# Patient Record
Sex: Male | Born: 1975
Health system: Southern US, Community
[De-identification: ages and names within clinical notes are randomized; demographics above are authoritative.]

## PROBLEM LIST (undated history)

## (undated) DIAGNOSIS — T7840XA Allergy, unspecified, initial encounter: Secondary | ICD-10-CM

## (undated) DIAGNOSIS — I10 Essential (primary) hypertension: Secondary | ICD-10-CM

## (undated) DIAGNOSIS — R569 Unspecified convulsions: Secondary | ICD-10-CM

## (undated) DIAGNOSIS — J45909 Unspecified asthma, uncomplicated: Secondary | ICD-10-CM

## (undated) HISTORY — PX: OTHER SURGICAL HISTORY: SHX169

## (undated) HISTORY — DX: Unspecified convulsions: R56.9

## (undated) HISTORY — DX: Unspecified asthma, uncomplicated: J45.909

## (undated) HISTORY — PX: KIDNEY SURGERY: SHX687

## (undated) HISTORY — DX: Essential (primary) hypertension: I10

## (undated) HISTORY — DX: Allergy, unspecified, initial encounter: T78.40XA

---

## 2001-06-06 HISTORY — PX: WRIST ARTHROSCOPY: SUR100

## 2003-01-20 ENCOUNTER — Ambulatory Visit (HOSPITAL_BASED_OUTPATIENT_CLINIC_OR_DEPARTMENT_OTHER): Admission: RE | Admit: 2003-01-20 | Discharge: 2003-01-20 | Payer: Self-pay | Admitting: Orthopedic Surgery

## 2011-01-25 ENCOUNTER — Ambulatory Visit (INDEPENDENT_AMBULATORY_CARE_PROVIDER_SITE_OTHER): Payer: 59 | Admitting: Gynecology

## 2011-01-25 DIAGNOSIS — N469 Male infertility, unspecified: Secondary | ICD-10-CM

## 2012-03-18 ENCOUNTER — Ambulatory Visit (INDEPENDENT_AMBULATORY_CARE_PROVIDER_SITE_OTHER): Payer: 59 | Admitting: Internal Medicine

## 2012-03-18 ENCOUNTER — Other Ambulatory Visit: Payer: Self-pay | Admitting: Internal Medicine

## 2012-03-18 ENCOUNTER — Ambulatory Visit: Payer: 59

## 2012-03-18 VITALS — BP 140/93 | HR 79 | Temp 97.6°F | Resp 18 | Ht 71.75 in | Wt 240.0 lb

## 2012-03-18 DIAGNOSIS — I10 Essential (primary) hypertension: Secondary | ICD-10-CM | POA: Insufficient documentation

## 2012-03-18 DIAGNOSIS — M25561 Pain in right knee: Secondary | ICD-10-CM

## 2012-03-18 DIAGNOSIS — M2391 Unspecified internal derangement of right knee: Secondary | ICD-10-CM

## 2012-03-18 DIAGNOSIS — M25569 Pain in unspecified knee: Secondary | ICD-10-CM

## 2012-03-18 DIAGNOSIS — M2390 Unspecified internal derangement of unspecified knee: Secondary | ICD-10-CM

## 2012-03-18 MED ORDER — MELOXICAM 15 MG PO TABS: 15.0000 mg | ORAL_TABLET | Freq: Every day | ORAL | Status: DC

## 2012-03-18 NOTE — Progress Notes (Addendum)
  Subjective:    Patient ID: Jerome Hensley, male    DOB: June 29, 1975, 36 y.o.   MRN: [id number]  HPIYesterday he jumped off fence and right leg buckled Within one to 2 hours he noted swelling and pain and inability to bear weight This remained overnight and today he is unable to walk/Comes in a wheelchair/crutches No prior injury  Nonsmoker No alcohol Married Works sitting/IT  Review of Systems On the systolic and trtibenzor were for hypertension   No recent medical problems  Objective:   Physical Exam Right knee is swollen generally without localized effusion He has restricted range of motion and is unable to reach 90 of flexion or 180 degr extension Markedly tender over the lateral collateral ligament with pain on stressing Tender over the lateral aspect of the quadriceps without defect Guarding prevents adequate anterior drawer or Lachman's Medial collateral ligament is nontender   [facility] reading (PRIMARY) by  Dr.Tykeem Lanzer= no fx      Assessment & Plan:  Problem #1 lateral collateral ligament sprain with ?int derangement/?ACL tear  Straight leg immobilizer/crutches/nonweightbearing/continue ice/Mobic 15 mg Set up MRI knee

## 2012-03-22 ENCOUNTER — Telehealth: Payer: Self-pay

## 2012-03-22 NOTE — Telephone Encounter (Signed)
Patient is calling for pain medication for his knee and to inquire about the status of his MRI. Let him know MRI is waiting for precert. He says Dr Merla Riches was supposed to send an rx to pharmacy for pain and nothing was sent.

## 2012-03-23 MED ORDER — HYDROCODONE-ACETAMINOPHEN 5-325 MG PO TABS: 1.0000 | ORAL_TABLET | Freq: Four times a day (QID) | ORAL | Status: DC | PRN

## 2012-03-23 NOTE — Telephone Encounter (Signed)
Called in rx for pt and let him know.

## 2012-03-23 NOTE — Telephone Encounter (Signed)
Rx for [employer].  I'm not comfortable starting with Percocet.

## 2012-04-04 ENCOUNTER — Telehealth: Payer: Self-pay

## 2012-04-04 NOTE — Telephone Encounter (Signed)
Needs peer to peer for this, case  # is [id number], phone # [phone], for MIR scan of right knee, if anyone has a chance, can we call on this today?

## 2012-04-04 NOTE — Telephone Encounter (Signed)
Patient is calling again about his referral for MRI. It has been over two weeks since he saw Dr Merla Riches and he called to follow up on 18th and nothing has been done yet. Can we figure out what is going on? If we still need a pre-cert or if we are just waiting to schedule the appointment? Referral notes say everything has been sent to clinical for pre-cert, but clinical says they don't have it. Thanks.  Best # to call him back [phone]

## 2012-04-04 NOTE — Telephone Encounter (Signed)
Patient is calling regarding referral from Dr Merla Riches. Spoke to

## 2012-04-05 NOTE — Telephone Encounter (Signed)
Authorization code is: [id number]

## 2012-04-05 NOTE — Telephone Encounter (Signed)
Thank you so much, Lupita Leash advised or auth # given by Ms Judithann Sheen at [facility]

## 2012-04-13 ENCOUNTER — Ambulatory Visit
Admission: RE | Admit: 2012-04-13 | Discharge: 2012-04-13 | Disposition: A | Discharge status: Home / Self Care | Payer: 59 | Source: Ambulatory Visit | Attending: Internal Medicine | Admitting: Internal Medicine

## 2012-04-13 ENCOUNTER — Other Ambulatory Visit: Payer: Self-pay | Admitting: Physician Assistant

## 2012-04-13 DIAGNOSIS — M25569 Pain in unspecified knee: Secondary | ICD-10-CM

## 2012-04-13 DIAGNOSIS — M2391 Unspecified internal derangement of right knee: Secondary | ICD-10-CM

## 2012-05-09 ENCOUNTER — Other Ambulatory Visit (HOSPITAL_COMMUNITY): Payer: Self-pay | Admitting: Orthopedic Surgery

## 2012-05-09 DIAGNOSIS — M25561 Pain in right knee: Secondary | ICD-10-CM

## 2012-05-11 ENCOUNTER — Ambulatory Visit (HOSPITAL_COMMUNITY)
Admission: RE | Admit: 2012-05-11 | Discharge: 2012-05-11 | Disposition: A | Discharge status: Home / Self Care | Payer: 59 | Source: Ambulatory Visit | Attending: Orthopedic Surgery | Admitting: Orthopedic Surgery

## 2012-05-11 DIAGNOSIS — M25561 Pain in right knee: Secondary | ICD-10-CM

## 2012-05-11 DIAGNOSIS — S72409A Unspecified fracture of lower end of unspecified femur, initial encounter for closed fracture: Secondary | ICD-10-CM | POA: Insufficient documentation

## 2012-05-11 DIAGNOSIS — X58XXXA Exposure to other specified factors, initial encounter: Secondary | ICD-10-CM | POA: Insufficient documentation

## 2012-05-11 DIAGNOSIS — M25569 Pain in unspecified knee: Secondary | ICD-10-CM | POA: Insufficient documentation

## 2014-06-08 ENCOUNTER — Ambulatory Visit (INDEPENDENT_AMBULATORY_CARE_PROVIDER_SITE_OTHER): Payer: 59 | Admitting: Family Medicine

## 2014-06-08 VITALS — BP 178/116 | HR 81 | Temp 97.6°F | Resp 18 | Ht 70.0 in | Wt 233.0 lb

## 2014-06-08 DIAGNOSIS — I10 Essential (primary) hypertension: Secondary | ICD-10-CM

## 2014-06-08 MED ORDER — LOSARTAN POTASSIUM-HCTZ 100-12.5 MG PO TABS: 1.0000 | ORAL_TABLET | Freq: Every day | ORAL | Status: DC

## 2014-06-08 MED ORDER — METOPROLOL SUCCINATE ER 100 MG PO TB24: 100.0000 mg | ORAL_TABLET | Freq: Every day | ORAL | Status: DC

## 2014-06-08 MED ORDER — AMLODIPINE BESYLATE 5 MG PO TABS: 5.0000 mg | ORAL_TABLET | Freq: Every day | ORAL | Status: DC

## 2014-06-08 NOTE — Patient Instructions (Signed)

## 2014-06-08 NOTE — Progress Notes (Signed)
Patient ID: Jerome Hensley MRN: [id number], DOB: 1975/06/30, 39 y.o. Date of Encounter: 06/08/2014, 12:17 PM  Primary Physician: Nestor Lewandowsky, MD  Chief Complaint: HTN  HPI: 39 y.o. year old male with history below presents for hypertension follow up.  He has had htn for 15 years.  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  Positive family history of htn  Diet consists of  Low salt diet.  Cooks at home. No CP, HA, visual changes, or focal deficits.   Past Medical History  Diagnosis Date  . Allergy   . Seizures   . Hypertension      Home Meds: Prior to Admission medications   Medication Sig Start Date End Date Taking? Authorizing Provider  carbamazepine (TEGRETOL) 200 MG tablet Take 200 mg by mouth 2 (two) times daily.   Yes Historical Provider, MD  nebivolol (BYSTOLIC) 10 MG tablet Take 10 mg by mouth daily.    Historical Provider, MD  Olmesartan-Amlodipine-HCTZ (TRIBENZOR) 40-10-25 MG TABS Take by mouth.    Historical Provider, MD    Allergies: No Known Allergies  History   Social History  . Marital Status: Married    Spouse Name: N/A    Number of Children: N/A  . Years of Education: N/A   Occupational History  . Not on file.   Social History Main Topics  . Smoking status: Never Smoker   . Smokeless tobacco: Not on file  . Alcohol Use: No  . Drug Use: No  . Sexual Activity: Yes   Other Topics Concern  . Not on file   Social History Narrative     No family history on file.  Review of Systems: Constitutional: negative for chills, fever, night sweats, weight changes, or fatigue  HEENT: negative for vision changes, hearing loss, congestion, rhinorrhea, ST, epistaxis, or sinus pressure Cardiovascular: negative for chest pain, palpitations, or DOE Respiratory: negative for hemoptysis, wheezing, shortness of breath, or cough Abdominal: negative for abdominal pain, nausea, vomiting, diarrhea, or constipation Dermatological: negative for  rash Neurologic: negative for headache, dizziness, or syncope All other systems reviewed and are otherwise negative with the exception to those above and in the HPI.   Physical Exam: Blood pressure 178/116, pulse 81, temperature 97.6 F (36.4 C), temperature source Oral, resp. rate 18, height  (1.778 m), weight 233 lb (105.688 kg), SpO2 99 %., Body mass index is 33.43 kg/(m^2). BP Readings from Last 3 Encounters:  06/08/14 178/116  03/18/12 140/93   Wt Readings from Last 3 Encounters:  06/08/14 233 lb (105.688 kg)  03/18/12 240 lb (108.863 kg)   General: Well developed, well nourished, in no acute distress. Head: Normocephalic, atraumatic, eyes without discharge, sclera non-icteric, nares are without discharge. Bilateral auditory canals clear, TM's are without perforation, pearly grey and translucent with reflective cone of light bilaterally. Oral cavity moist, posterior pharynx without exudate, erythema, peritonsillar abscess, or post nasal drip.  Neck: Supple. No thyromegaly. Full ROM. No lymphadenopathy. No carotid bruits. Lungs: Clear bilaterally to auscultation without wheezes, rales, or rhonchi. Breathing is unlabored. Heart: RRR with S1 S2. No murmurs, rubs, or gallops appreciated.  Abdomen: Soft, non-tender, non-distended with normoactive bowel sounds. No hepatosplenomegaly. No rebound/guarding. No obvious abdominal masses. Msk:  Strength and tone normal for age. Extremities/Skin: Warm and dry. No clubbing or cyanosis. No edema. No rashes or suspicious lesions. Distal pulses 2+ and equal bilaterally. Neuro: Alert and oriented X 3. Moves all extremities spontaneously. Gait is normal. CNII-XII grossly  in tact. DTR 2+, cerebellar function intact. Rhomberg normal. Psych:  Responds to questions appropriately with a normal affect.    ASSESSMENT AND PLAN:  39 y.o. year old male with Essential hypertension - Plan: amLODipine (NORVASC) 5 MG tablet, losartan-hydrochlorothiazide  (HYZAAR) 100-12.5 MG per tablet, metoprolol succinate (TOPROL-XL) 100 MG 24 hr tablet   Signed, Elvina Sidle, MD 06/08/2014 12:17 PM

## 2014-06-23 ENCOUNTER — Ambulatory Visit: Payer: 59

## 2014-06-23 ENCOUNTER — Ambulatory Visit (INDEPENDENT_AMBULATORY_CARE_PROVIDER_SITE_OTHER): Payer: 59 | Admitting: Family Medicine

## 2014-06-23 VITALS — BP 162/98 | HR 107 | Temp 99.8°F | Resp 20 | Ht 70.0 in | Wt 231.0 lb

## 2014-06-23 DIAGNOSIS — I10 Essential (primary) hypertension: Secondary | ICD-10-CM

## 2014-06-23 DIAGNOSIS — J0110 Acute frontal sinusitis, unspecified: Secondary | ICD-10-CM

## 2014-06-23 DIAGNOSIS — R000 Tachycardia, unspecified: Secondary | ICD-10-CM

## 2014-06-23 DIAGNOSIS — I471 Supraventricular tachycardia: Secondary | ICD-10-CM

## 2014-06-23 MED ORDER — AMOXICILLIN-POT CLAVULANATE 875-125 MG PO TABS: 1.0000 | ORAL_TABLET | Freq: Two times a day (BID) | ORAL | Status: DC

## 2014-06-23 MED ORDER — PREDNISONE 20 MG PO TABS: 40.0000 mg | ORAL_TABLET | Freq: Every day | ORAL | Status: DC

## 2014-06-23 NOTE — Patient Instructions (Signed)
Hot showers or breathing in steam may help loosen the congestion.  Using a netti pot or sinus rinse is also likely to help you feel better and keep this from progressing.  Use the fluticasone nasal spray every night before bed for at least 2 weeks.  I recommend augmenting with  generic mucinex to help you move out the congestion.  Go ahead and start the prednisone tomorrow morning but keep an eye on your blood pressure and pulse - if your heart rate is >100 or your blood pressure is >160/90 I would recommend not taking or stopping the course of prednisone (even if you are in the middle of it.)   Sinusitis Sinusitis is redness, soreness, and inflammation of the paranasal sinuses. Paranasal sinuses are air pockets within the bones of your face (beneath the eyes, the middle of the forehead, or above the eyes). In healthy paranasal sinuses, mucus is able to drain out, and air is able to circulate through them by way of your nose. However, when your paranasal sinuses are inflamed, mucus and air can become trapped. This can allow bacteria and other germs to grow and cause infection. Sinusitis can develop quickly and last only a short time (acute) or continue over a long period (chronic). Sinusitis that lasts for more than 12 weeks is considered chronic.  CAUSES  Causes of sinusitis include:  Allergies.  Structural abnormalities, such as displacement of the cartilage that separates your nostrils (deviated septum), which can decrease the air flow through your nose and sinuses and affect sinus drainage.  Functional abnormalities, such as when the small hairs (cilia) that line your sinuses and help remove mucus do not work properly or are not present. SIGNS AND SYMPTOMS  Symptoms of acute and chronic sinusitis are the same. The primary symptoms are pain and pressure around the affected sinuses. Other symptoms include:  Upper toothache.  Earache.  Headache.  Bad breath.  Decreased sense of smell and  taste.  A cough, which worsens when you are lying flat.  Fatigue.  Fever.  Thick drainage from your nose, which often is green and may contain pus (purulent).  Swelling and warmth over the affected sinuses. DIAGNOSIS  Your health care provider will perform a physical exam. During the exam, your health care provider may:  Look in your nose for signs of abnormal growths in your nostrils (nasal polyps).  Tap over the affected sinus to check for signs of infection.  View the inside of your sinuses (endoscopy) using an imaging device that has a light attached (endoscope). If your health care provider suspects that you have chronic sinusitis, one or more of the following tests may be recommended:  Allergy tests.  Nasal culture. A sample of mucus is taken from your nose, sent to a lab, and screened for bacteria.  Nasal cytology. A sample of mucus is taken from your nose and examined by your health care provider to determine if your sinusitis is related to an allergy. TREATMENT  Most cases of acute sinusitis are related to a viral infection and will resolve on their own within 10 days. Sometimes medicines are prescribed to help relieve symptoms (pain medicine, decongestants, nasal steroid sprays, or saline sprays).  However, for sinusitis related to a bacterial infection, your health care provider will prescribe antibiotic medicines. These are medicines that will help kill the bacteria causing the infection.  Rarely, sinusitis is caused by a fungal infection. In theses cases, your health care provider will prescribe antifungal medicine. For  some cases of chronic sinusitis, surgery is needed. Generally, these are cases in which sinusitis recurs more than 3 times per year, despite other treatments. HOME CARE INSTRUCTIONS   Drink plenty of water. Water helps thin the mucus so your sinuses can drain more easily.  Use a humidifier.  Inhale steam 3 to 4 times a day (for example, sit in the  bathroom with the shower running).  Apply a warm, moist washcloth to your face 3 to 4 times a day, or as directed by your health care provider.  Use saline nasal sprays to help moisten and clean your sinuses.  Take medicines only as directed by your health care provider.  If you were prescribed either an antibiotic or antifungal medicine, finish it all even if you start to feel better. SEEK IMMEDIATE MEDICAL CARE IF:  You have increasing pain or severe headaches.  You have nausea, vomiting, or drowsiness.  You have swelling around your face.  You have vision problems.  You have a stiff neck.  You have difficulty breathing. MAKE SURE YOU:   Understand these instructions.  Will watch your condition.  Will get help right away if you are not doing well or get worse. Document Released: 05/23/2005 Document Revised: 10/07/2013 Document Reviewed: 06/07/2011 [facility] Patient Information 2015 [employer] [place]. This information is not intended to replace advice given to you by your health care provider. Make sure you discuss any questions you have with your health care provider.

## 2014-06-23 NOTE — Progress Notes (Signed)
Subjective:  This chart was scribed for Norberto SorensonEva Cochise Dinneen, MD, by Elon SpannerGarrett Cook, Medical Scribe. This patient was seen in room Rm 10 and the patient's care was started at 4:14 PM.   Patient ID: Jerome Hensley, male    DOB: March 01, 1976, 39 y.o.   MRN: [id number] Chief Complaint  Patient presents with  . Nasal Congestion    x 2 days    HPI HPI Comments: Jerome Hensley is a 39 y.o. male [facility] worsening nasal congestion onset greater than one week ago.  He reports his sputum has been dark yellow with a small amount of bright red blood in the morning.  Patient also complains of associated right sinus pressure in forehead and beneath the eye, a headache, and currently resolved cough and sore throat.  Patient has taken Nyquil for 2 nights and observed transient relief.  He has also used chorocidine HBP, a Neti Pot, nasal spray and ibuprofen.  Patient reports a history of HTN and uses an at-home blood pressure cuff; typically measuring a BP in the 160/90 range.  Patient denies fever, appetite changes, recent antibiotic use.  NKA.  Past Medical History  Diagnosis Date  . Allergy   . Seizures   . Hypertension    Current Outpatient Prescriptions on File Prior to Visit  Medication Sig Dispense Refill  . amLODipine (NORVASC) 5 MG tablet Take 1 tablet (5 mg total) by mouth daily. 30 tablet 11  . carbamazepine (TEGRETOL) 200 MG tablet Take 200 mg by mouth 2 (two) times daily.    Marland Kitchen. losartan-hydrochlorothiazide (HYZAAR) 100-12.5 MG per tablet Take 1 tablet by mouth daily. 30 tablet 11  . metoprolol succinate (TOPROL-XL) 100 MG 24 hr tablet Take 1 tablet (100 mg total) by mouth daily. Take with or immediately following a meal. 30 tablet 11   No current facility-administered medications on file prior to visit.   No Known Allergies   Review of Systems  Constitutional: Positive for chills. Negative for fever and appetite change.  HENT: Positive for congestion and sinus  pressure.   Neurological: Positive for headaches.  Psychiatric/Behavioral: Negative for sleep disturbance.       Objective:  BP 162/98 mmHg  Pulse 107  Temp(Src) 99.8 F (37.7 C)  Resp 20  Ht 5\' 10"  (1.778 m)  Wt 231 lb (104.781 kg)  BMI 33.15 kg/m2  SpO2 99%  Physical Exam  Constitutional: He is oriented to person, place, and time. He appears well-developed and well-nourished. No distress.  HENT:  Head: Normocephalic and atraumatic.  Cerumen bock nose bilaterally.  Mild nasal mucosal edema and erythema with friable nasal mucosa.  Oropharynx normal.  No erythema edema or exudate.    Eyes: Conjunctivae and EOM are normal.  Neck: Neck supple. No tracheal deviation present.  Normal thyroid no cervical adenopathy.    Cardiovascular: Regular rhythm.   No murmur heard. Tachycardia. Normal S1/S2  Pulmonary/Chest: Effort normal and breath sounds normal. No respiratory distress. He has no wheezes. He has no rales.  Lungs CTA.  Musculoskeletal: Normal range of motion.  Lymphadenopathy:    He has no cervical adenopathy.  Neurological: He is alert and oriented to person, place, and time.  Skin: Skin is warm and dry.  Psychiatric: He has a normal mood and affect. His behavior is normal.  Nursing note and vitals reviewed.         Assessment & Plan:  4:20 PM Will prescribe antibiotic.  Will also prescribe prednisone to be started  tomorrow if BP and pulse rate are normal as measured by patient at home.  Patient advised to stop prednisone if he heart rate or BP is elevated.   Start mucinex and try netti pot, start flonase Acute frontal sinusitis, recurrence not specified  Essential hypertension, benign  Sinus tachycardia  Meds ordered this encounter  Medications  . montelukast (SINGULAIR) 10 MG tablet    Sig: Take 10 mg by mouth at bedtime.  Marland Kitchen levocetirizine (XYZAL) 5 MG tablet    Sig: Take 5 mg by mouth every evening.  . Fluticasone-Salmeterol (ADVAIR) 250-50 MCG/DOSE AEPB     Sig: Inhale 1 puff into the lungs 2 (two) times daily.  . fluticasone (FLOVENT DISKUS) 50 MCG/BLIST diskus inhaler    Sig: Inhale 1 puff into the lungs 2 (two) times daily.  . TURMERIC PO    Sig: Take by mouth.  Marland Kitchen amoxicillin-clavulanate (AUGMENTIN) 875-125 MG per tablet    Sig: Take 1 tablet by mouth 2 (two) times daily.    Dispense:  20 tablet    Refill:  0  . predniSONE (DELTASONE) 20 MG tablet    Sig: Take 2 tablets (40 mg total) by mouth daily with breakfast.    Dispense:  10 tablet    Refill:  0    I personally performed the services described in this documentation, which was scribed in my presence. The recorded information has been reviewed and considered, and addended by me as needed.  Norberto Sorenson, MD MPH

## 2014-07-10 ENCOUNTER — Ambulatory Visit: Payer: 59 | Admitting: Family Medicine

## 2014-07-14 ENCOUNTER — Encounter: Payer: Self-pay | Admitting: Family Medicine

## 2014-07-14 ENCOUNTER — Ambulatory Visit (INDEPENDENT_AMBULATORY_CARE_PROVIDER_SITE_OTHER): Payer: 59 | Admitting: Family Medicine

## 2014-07-14 VITALS — BP 142/92 | HR 82 | Temp 97.8°F | Ht 70.75 in | Wt 237.9 lb

## 2014-07-14 DIAGNOSIS — J4540 Moderate persistent asthma, uncomplicated: Secondary | ICD-10-CM

## 2014-07-14 DIAGNOSIS — J302 Other seasonal allergic rhinitis: Secondary | ICD-10-CM | POA: Insufficient documentation

## 2014-07-14 DIAGNOSIS — G40909 Epilepsy, unspecified, not intractable, without status epilepticus: Secondary | ICD-10-CM

## 2014-07-14 DIAGNOSIS — J45909 Unspecified asthma, uncomplicated: Secondary | ICD-10-CM

## 2014-07-14 DIAGNOSIS — G40309 Generalized idiopathic epilepsy and epileptic syndromes, not intractable, without status epilepticus: Secondary | ICD-10-CM | POA: Insufficient documentation

## 2014-07-14 DIAGNOSIS — I10 Essential (primary) hypertension: Secondary | ICD-10-CM

## 2014-07-14 HISTORY — DX: Unspecified asthma, uncomplicated: J45.909

## 2014-07-14 MED ORDER — HYDROCHLOROTHIAZIDE 25 MG PO TABS: 25.0000 mg | ORAL_TABLET | Freq: Every day | ORAL | Status: DC

## 2014-07-14 MED ORDER — LOSARTAN POTASSIUM 100 MG PO TABS: 100.0000 mg | ORAL_TABLET | Freq: Every day | ORAL | Status: DC

## 2014-07-14 NOTE — Progress Notes (Signed)
HPI:  Jerome Hensley is here to establish care. Has lived in [place] years. Reports was not happy as his blood pressure continued to run high and no adjustments made. Last PCP and physical:  Has the following chronic problems that require follow up and concerns today:  HTN: -reports has had blood pressure problems for many years -meds: norvasc 5mg , losartan-hctz 100-12.5, toprol xl 100mg  -reports saw a cardiologist in 2012 for his blood pressure since it was difficult to control - was on a lot of medications -reports had stress test and echo and all was fine -he reports since had trouble with refills/insurance and meds changed -strong FH HTN -no regular exercise - hx college football -diet is ok - avoids beef, lots of fruit and veggies -does not smoking -denies hx hyperlipidemia  Seizure disorder: -managed by [facility]  Seasonal allergies/Asthma: -sees allergist for this  ROS negative for unless reported above: fevers, unintentional weight loss, hearing or vision loss, chest pain, palpitations, struggling to breath, hemoptysis, melena, hematochezia, hematuria, falls, loc, si, thoughts of self harm  Past Medical History  Diagnosis Date  . Allergy   . Seizures   . Hypertension   . Asthma, chronic 07/14/2014    Past Surgical History  Procedure Laterality Date  . Mole excision    . Wrist arthroscopy Left 2003    Family History  Problem Relation Age of Onset  . Heart disease Mother   . Heart disease Maternal Uncle   . Arthritis Maternal Grandmother   . Prostate cancer Maternal Uncle   . Diabetes Maternal Uncle     History   Social History  . Marital Status: Married    Spouse Name: N/A    Number of Children: N/A  . Years of Education: N/A   Social History Main Topics  . Smoking status: Never Smoker   . Smokeless tobacco: None  . Alcohol Use: No  . Drug Use: No  . Sexual Activity: Yes   Other Topics Concern  . 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  amLODipine (NORVASC) 5 MG tablet, Take 1 tablet (5 mg total) by mouth daily., Disp: 30 tablet, Rfl: 11 .  aspirin 81 MG tablet, Take 81 mg by mouth daily., Disp: , Rfl:  .  carbamazepine (TEGRETOL) 200 MG tablet, Take 200 mg by mouth 2 (two) times daily., Disp: , Rfl:  .  fluticasone (FLOVENT DISKUS) 50 MCG/BLIST diskus inhaler, Inhale 1 puff into the lungs 2 (two) times daily., Disp: , Rfl:  .  Fluticasone-Salmeterol (ADVAIR) 250-50 MCG/DOSE AEPB, Inhale 1 puff into the lungs 2 (two) times daily., Disp: , Rfl:  .  levocetirizine (XYZAL) 5 MG tablet, Take 5 mg by mouth every evening., Disp: , Rfl:  .  metoprolol succinate (TOPROL-XL) 100 MG 24 hr tablet, Take 1 tablet (100 mg total) by mouth daily. Take with or immediately following a meal., Disp: 30 tablet, Rfl: 11 .  montelukast (SINGULAIR) 10 MG tablet, Take 10 mg by mouth at bedtime., Disp: , Rfl:  .  TURMERIC PO, Take by mouth., Disp: , Rfl:  .  hydrochlorothiazide (HYDRODIURIL) 25 MG  tablet, Take 1 tablet (25 mg total) by mouth daily., Disp: 90 tablet, Rfl: 3 .  losartan (COZAAR) 100 MG tablet, Take 1 tablet (100 mg total) by mouth daily., Disp: 90 tablet, Rfl: 3  EXAM:  Filed Vitals:   07/14/14 1629  BP: 142/92  Pulse: 82  Temp: 97.8 F (36.6 C)    Body mass index is 33.42 kg/(m^2).  GENERAL: vitals reviewed and listed above, alert, oriented, appears well hydrated and in no acute distress  HEENT: atraumatic, conjunttiva clear, no obvious abnormalities on inspection of external nose and ears  NECK: no obvious masses on inspection  LUNGS: clear to auscultation bilaterally, no wheezes, rales or rhonchi, good air movement  CV: HRRR, no peripheral edema  MS:  moves all extremities without noticeable abnormality  PSYCH: pleasant and cooperative, no obvious depression or anxiety  ASSESSMENT AND PLAN:  Discussed the following assessment and plan:  Essential hypertension - Plan: losartan (COZAAR) 100 MG tablet, hydrochlorothiazide (HYDRODIURIL) 25 MG tablet -labs next visit -increase hctz -can do trial increased norvasc if needed, change to coreg, add clonidine if needed -advised increased exercise  Seizure disorder  Asthma, chronic, moderate persistent, uncomplicated  Seasonal allergies  -We reviewed the PMH, PSH, FH, SH, Meds and Allergies. -We provided refills for any medications we will prescribe as needed. -We addressed current concerns per orders and patient instructions. -We have asked for records for pertinent exams, studies, vaccines and notes from previous providers. -We have advised patient to follow up per instructions below.   -Patient advised to return or notify a doctor immediately if symptoms worsen or persist or new concerns arise.  Patient Instructions  BEFORE YOU LEAVE: -follow up in 2-4 weeks in the morning - come fasting but drink plenty of water  Please increase your hydrochlorothiazide  daily  We recommend the following healthy lifestyle measures: - eat a healthy diet consisting of lots of vegetables, fruits, beans, nuts, seeds, healthy meats such as white chicken and fish and whole grains.  - avoid fried foods, fast food, processed foods, sodas, red meet and other fattening foods.  - get a least 150-300 minutes of aerobic exercise per week.       Kriste Basque R.

## 2014-07-14 NOTE — Progress Notes (Signed)
Pre visit review using our clinic review tool, if applicable. No additional management support is needed unless otherwise documented below in the visit note. 

## 2014-07-14 NOTE — Patient Instructions (Signed)
BEFORE YOU LEAVE: -follow up in 2-4 weeks in the morning - come fasting but drink plenty of water  Please increase your hydrochlorothiazide 25mg  daily  We recommend the following healthy lifestyle measures: - eat a healthy diet consisting of lots of vegetables, fruits, beans, nuts, seeds, healthy meats such as white chicken and fish and whole grains.  - avoid fried foods, fast food, processed foods, sodas, red meet and other fattening foods.  - get a least 150-300 minutes of aerobic exercise per week.

## 2014-07-14 NOTE — Addendum Note (Signed)
Addended by: Terressa KoyanagiKIM, Jakarri Lesko R on: 07/14/2014 05:06 PM   Modules accepted: Medications

## 2014-08-04 ENCOUNTER — Ambulatory Visit (INDEPENDENT_AMBULATORY_CARE_PROVIDER_SITE_OTHER): Payer: 59 | Admitting: Family Medicine

## 2014-08-04 ENCOUNTER — Encounter: Payer: Self-pay | Admitting: Family Medicine

## 2014-08-04 VITALS — BP 136/84 | HR 77 | Temp 98.2°F | Ht 70.0 in | Wt 234.2 lb

## 2014-08-04 DIAGNOSIS — E669 Obesity, unspecified: Secondary | ICD-10-CM

## 2014-08-04 DIAGNOSIS — I10 Essential (primary) hypertension: Secondary | ICD-10-CM

## 2014-08-04 DIAGNOSIS — Z23 Encounter for immunization: Secondary | ICD-10-CM

## 2014-08-04 LAB — BASIC METABOLIC PANEL
BUN: 17 mg/dL (ref 6–23)
CALCIUM: 10.4 mg/dL (ref 8.4–10.5)
CO2: 34 mEq/L — ABNORMAL HIGH (ref 19–32)
Chloride: 99 mEq/L (ref 96–112)
Creatinine, Ser: 1.08 mg/dL (ref 0.40–1.50)
GFR: 97 mL/min (ref >60.00)
GLUCOSE: 99 mg/dL (ref 70–99)
POTASSIUM: 4 meq/L (ref 3.5–5.1)
SODIUM: 140 meq/L (ref 135–145)

## 2014-08-04 LAB — LIPID PANEL
CHOL/HDL RATIO: 3
CHOLESTEROL: 169 mg/dL (ref 0–200)
HDL: 58.4 mg/dL (ref >39.00)
LDL CALC: 89 mg/dL (ref 0–99)
NonHDL: 110.6
TRIGLYCERIDES: 109 mg/dL (ref 0.0–149.0)
VLDL: 21.8 mg/dL (ref 0.0–40.0)

## 2014-08-04 LAB — HEMOGLOBIN A1C: HEMOGLOBIN A1C: 5.8 % (ref 4.6–6.5)

## 2014-08-04 NOTE — Patient Instructions (Addendum)
BEFORE YOU LEAVE: -labs -schedule 3 month -Tdap  We recommend the following healthy lifestyle measures: - eat a healthy diet consisting of lots of vegetables, fruits, beans, nuts, seeds, healthy meats such as white chicken and fish and whole grains.  - avoid fried foods, fast food, processed foods, sodas, red meet and other fattening foods.  - get a least 150 minutes of aerobic exercise per week.

## 2014-08-04 NOTE — Progress Notes (Signed)
Pre visit review using our clinic review tool, if applicable. No additional management support is needed unless otherwise documented below in the visit note. 

## 2014-08-04 NOTE — Progress Notes (Signed)
HPI:  HTN: -difficult to control BP -meds: asa, norvasc 5mg , toprol xl 100mg , increased HCTZ 25mg  07/2014, losartan 100, hydralazine 50 bid -reports saw a cardiologist in 2012 and had stress test and echo and all was fine -strong FH HTN -no regular exercise - hx college football - he reports he knows he is going to do better with this -diet is ok - avoids beef, lots of fruit and veggies -does not smoke -denies hx hyperlipidemia -denies: CP, SOB, DOE, swelling  Seizure disorder: -managed by [facility]  Seasonal allergies/Asthma: -sees allergist for this  ROS: See pertinent positives and negatives per HPI.  Past Medical History  Diagnosis Date  . Allergy   . Seizures   . Hypertension   . Asthma, chronic 07/14/2014    Past Surgical History  Procedure Laterality Date  . Mole excision    . Wrist arthroscopy Left 2003    Family History  Problem Relation Age of Onset  . Heart disease Mother   . Heart disease Maternal Uncle   . Arthritis Maternal Grandmother   . Prostate cancer Maternal Uncle   . Diabetes Maternal Uncle     History   Social History  . Marital Status: Married    Spouse Name: N/A  . Number of Children: N/A  . Years of Education: N/A   Social History Main Topics  . Smoking status: Never Smoker   . Smokeless tobacco: Not on file  . Alcohol Use: No  . Drug Use: No  . Sexual Activity: Yes   Other Topics Concern  . 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  amLODipine (NORVASC) 5 MG tablet, Take 1 tablet (5 mg total) by mouth daily., Disp: 30 tablet, Rfl: 11 .  aspirin 81 MG tablet, Take 81 mg by mouth daily., Disp: , Rfl:  .  carbamazepine (TEGRETOL) 200 MG tablet, Take 200 mg by mouth 2 (two) times  daily., Disp: , Rfl:  .  Cholecalciferol (VITAMIN D PO), Take 8,000 Units by mouth daily., Disp: , Rfl:  .  fluticasone (FLOVENT DISKUS) 50 MCG/BLIST diskus inhaler, Inhale 1 puff into the lungs 2 (two) times daily., Disp: , Rfl:  .  Fluticasone-Salmeterol (ADVAIR) 250-50 MCG/DOSE AEPB, Inhale 1 puff into the lungs 2 (two) times daily., Disp: , Rfl:  .  hydrALAZINE (APRESOLINE) 50 MG tablet, Take 50 mg by mouth 2 (two) times daily., Disp: , Rfl:  .  hydrochlorothiazide (HYDRODIURIL) 25 MG tablet, Take 1 tablet (25 mg total) by mouth daily., Disp: 90 tablet, Rfl: 3 .  levocetirizine (XYZAL) 5 MG tablet, Take 5 mg by mouth every evening., Disp: , Rfl:  .  losartan (COZAAR) 100 MG tablet, Take 1 tablet (100 mg total) by mouth daily., Disp: 90 tablet, Rfl: 3 .  metoprolol succinate (TOPROL-XL) 100 MG 24 hr tablet, Take 1 tablet (100 mg total) by mouth daily. Take with or immediately following a meal., Disp: 30 tablet, Rfl: 11 .  montelukast (SINGULAIR) 10 MG tablet, Take 10 mg by mouth at bedtime., Disp: , Rfl:  .  TURMERIC PO, Take by mouth., Disp: , Rfl:   EXAM:  Filed Vitals:  08/04/14 0843  BP: 136/84  Pulse: 77  Temp: 98.2 F (36.8 C)    Body mass index is 33.6 kg/(m^2).  GENERAL: vitals reviewed and listed above, alert, oriented, appears well hydrated and in no acute distress  HEENT: atraumatic, conjunttiva clear, no obvious abnormalities on inspection of external nose and ears  NECK: no obvious masses on inspection  LUNGS: clear to auscultation bilaterally, no wheezes, rales or rhonchi, good air movement  CV: HRRR, no peripheral edema  MS: moves all extremities without noticeable abnormality  PSYCH: pleasant and cooperative, no obvious depression or anxiety  ASSESSMENT AND PLAN:  Discussed the following assessment and plan:  Essential hypertension  Obesity - Plan: Lipid Panel, Hemoglobin A1c, Basic metabolic panel  -FASTING labs: doing fasting labs today -offered  Tdap and HIV testing: he opted to get the Tdap today, declined HIV -Patient advised to return or notify a doctor immediately if symptoms worsen or persist or new concerns arise.  Patient Instructions  BEFORE YOU LEAVE: -labs -schedule 3 month  We recommend the following healthy lifestyle measures: - eat a healthy diet consisting of lots of vegetables, fruits, beans, nuts, seeds, healthy meats such as white chicken and fish and whole grains.  - avoid fried foods, fast food, processed foods, sodas, red meet and other fattening foods.  - get a least 150 minutes of aerobic exercise per week.       Kriste Basque R.

## 2014-08-04 NOTE — Addendum Note (Signed)
Addended by: Johnella MoloneyFUNDERBURK, JO A on: 08/04/2014 09:25 AM   Modules accepted: Orders

## 2014-08-05 ENCOUNTER — Telehealth: Payer: Self-pay | Admitting: Family Medicine

## 2014-08-05 NOTE — Telephone Encounter (Signed)
emmi emailed °

## 2014-10-31 ENCOUNTER — Ambulatory Visit: Payer: 59 | Admitting: Family Medicine

## 2014-11-19 ENCOUNTER — Ambulatory Visit (INDEPENDENT_AMBULATORY_CARE_PROVIDER_SITE_OTHER): Payer: Self-pay | Admitting: Family Medicine

## 2014-11-19 DIAGNOSIS — R69 Illness, unspecified: Secondary | ICD-10-CM

## 2014-11-19 NOTE — Progress Notes (Signed)
NO SHOW  Previsit review of chart:  HTN: -meds: asa, norvasc 5mg , toprol xl 100mg , increased HCTZ 25mg  07/2014, losartan 100, hydralazine 50 bid -reports saw a cardiologist in 2012 and had stress test and echo and all was fine -strong FH HTN -no regular exercise - hx college football - he reports he knows he is going to do better with this -diet is ok - avoids beef, lots of fruit and veggies -does not smoke -denies hx hyperlipidemia -denies:   Seizure disorder: -managed by [facility] -meds: tegretol  Seasonal allergies/Asthma: -sees allergist for this

## 2015-02-05 ENCOUNTER — Other Ambulatory Visit: Payer: Self-pay | Admitting: *Deleted

## 2015-02-05 DIAGNOSIS — I10 Essential (primary) hypertension: Secondary | ICD-10-CM

## 2015-02-05 MED ORDER — LOSARTAN POTASSIUM 100 MG PO TABS: 100.0000 mg | ORAL_TABLET | Freq: Every day | ORAL | Status: DC

## 2015-02-06 ENCOUNTER — Other Ambulatory Visit: Payer: Self-pay

## 2015-02-06 DIAGNOSIS — I10 Essential (primary) hypertension: Secondary | ICD-10-CM

## 2015-02-06 NOTE — Telephone Encounter (Signed)
Rx request for HCTZ 25 MG #90  [facility].

## 2015-02-16 ENCOUNTER — Other Ambulatory Visit: Payer: Self-pay | Admitting: Family Medicine

## 2015-02-16 DIAGNOSIS — I10 Essential (primary) hypertension: Secondary | ICD-10-CM

## 2015-02-16 MED ORDER — HYDROCHLOROTHIAZIDE 25 MG PO TABS: 25.0000 mg | ORAL_TABLET | Freq: Every day | ORAL | Status: DC

## 2015-02-16 NOTE — Telephone Encounter (Signed)
Switched to [facility] from [facility] [address]  Sent in for 1 year supply on [facility]/14/14 to [facility]. Sent in 90 day supply.

## 2015-03-09 ENCOUNTER — Encounter: Payer: Self-pay | Admitting: Family Medicine

## 2015-03-09 ENCOUNTER — Ambulatory Visit (INDEPENDENT_AMBULATORY_CARE_PROVIDER_SITE_OTHER): Payer: 59 | Admitting: Family Medicine

## 2015-03-09 VITALS — BP 124/86 | HR 89 | Temp 98.1°F | Ht 70.0 in | Wt 236.3 lb

## 2015-03-09 DIAGNOSIS — G40909 Epilepsy, unspecified, not intractable, without status epilepticus: Secondary | ICD-10-CM | POA: Diagnosis not present

## 2015-03-09 DIAGNOSIS — J4540 Moderate persistent asthma, uncomplicated: Secondary | ICD-10-CM

## 2015-03-09 DIAGNOSIS — Z6833 Body mass index (BMI) 33.0-33.9, adult: Secondary | ICD-10-CM

## 2015-03-09 DIAGNOSIS — J302 Other seasonal allergic rhinitis: Secondary | ICD-10-CM

## 2015-03-09 DIAGNOSIS — R739 Hyperglycemia, unspecified: Secondary | ICD-10-CM

## 2015-03-09 DIAGNOSIS — I10 Essential (primary) hypertension: Secondary | ICD-10-CM | POA: Diagnosis not present

## 2015-03-09 LAB — BASIC METABOLIC PANEL
BUN: 14 mg/dL (ref 6–23)
CALCIUM: 10.1 mg/dL (ref 8.4–10.5)
CO2: 36 meq/L — AB (ref 19–32)
CREATININE: 1.2 mg/dL (ref 0.40–1.50)
Chloride: 98 mEq/L (ref 96–112)
GFR: 85.65 mL/min (ref >60.00)
Glucose, Bld: 93 mg/dL (ref 70–99)
Potassium: 3.9 mEq/L (ref 3.5–5.1)
Sodium: 140 mEq/L (ref 135–145)

## 2015-03-09 LAB — HEMOGLOBIN A1C: Hgb A1c MFr Bld: 5.5 % (ref 4.6–6.5)

## 2015-03-09 LAB — CHOLESTEROL, TOTAL: Cholesterol: 154 mg/dL (ref 0–200)

## 2015-03-09 LAB — HDL CHOLESTEROL: HDL: 54.3 mg/dL (ref >39.00)

## 2015-03-09 NOTE — Progress Notes (Signed)
Pre visit review using our clinic review tool, if applicable. No additional management support is needed unless otherwise documented below in the visit note. 

## 2015-03-09 NOTE — Progress Notes (Signed)
HPI:  Jerome Hensley is a 39 yo M with difficult to control HTN and a history of poor compliance here for follow up. He has not been in in > 7 months and did not show up for his last appointment.  HTN/Mild prediabetes: -difficult to control BP, on many medications when established with me -meds: asa, norvasc , toprol xl , HCTZ , losartan 100, hydralazine 50 bid -reports saw a cardiologist in 2012 and had stress test and echo and all was fine -strong FH HTN -doing better on diet and exercise (10,000 steps and 80% paleo and avoiding sweets) -does not smoke -denies hx hyperlipidemia -denies: CP, SOB, DOE, swelling  Seizure disorder: -managed by [facility]  Seasonal allergies/Asthma: -sees allergist for this  ROS: See pertinent positives and negatives per HPI.  Past Medical History  Diagnosis Date  . Allergy   . Seizures (HCC)   . Hypertension   . Asthma, chronic 07/14/2014    Past Surgical History  Procedure Laterality Date  . Mole excision    . Wrist arthroscopy Left 2003    Family History  Problem Relation Age of Onset  . Heart disease Mother   . Heart disease Maternal Uncle   . Arthritis Maternal Grandmother   . Prostate cancer Maternal Uncle   . Diabetes Maternal Uncle     Social History   Social History  . Marital Status: Married    Spouse Name: N/A  . Number of Children: N/A  . Years of Education: N/A   Social History Main Topics  . Smoking status: Never Smoker   . Smokeless tobacco: None  . Alcohol Use: No  . Drug Use: No  . Sexual Activity: Yes   Other Topics Concern  . 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  amLODipine (NORVASC) 5 MG tablet, Take 1 tablet (5 mg total) by mouth  daily., Disp: 30 tablet, Rfl: 11 .  aspirin 81 MG tablet, Take 81 mg by mouth daily., Disp: , Rfl:  .  carbamazepine (TEGRETOL) 200 MG tablet, Take 200 mg by mouth 2 (two) times daily., Disp: , Rfl:  .  Cholecalciferol (VITAMIN D PO), Take 8,000 Units by mouth daily., Disp: , Rfl:  .  fluticasone (FLOVENT DISKUS) 50 MCG/BLIST diskus inhaler, Inhale 1 puff into the lungs 2 (two) times daily., Disp: , Rfl:  .  Fluticasone-Salmeterol (ADVAIR) 250-50 MCG/DOSE AEPB, Inhale 1 puff into the lungs 2 (two) times daily., Disp: , Rfl:  .  hydrALAZINE (APRESOLINE) 50 MG tablet, Take 50 mg by mouth 2 (two) times daily., Disp: , Rfl:  .  hydrochlorothiazide (HYDRODIURIL) 25 MG tablet, Take 1 tablet (25 mg total) by mouth daily., Disp: 90 tablet, Rfl: 0 .  levocetirizine (XYZAL) 5 MG tablet, Take 5 mg by mouth every evening., Disp: , Rfl:  .  losartan (COZAAR) 100 MG tablet, Take 1 tablet (100 mg total) by mouth daily., Disp: 90 tablet, Rfl: 0 .  metoprolol succinate (TOPROL-XL) 100 MG 24 hr tablet, Take 1 tablet (100 mg total) by mouth daily. Take with or immediately following a meal., Disp: 30 tablet, Rfl: 11 .  montelukast (SINGULAIR) 10  MG tablet, Take 10 mg by mouth at bedtime., Disp: , Rfl:  .  TURMERIC PO, Take by mouth., Disp: , Rfl:   EXAM:  Filed Vitals:   03/09/15 1450  BP: 124/86  Pulse: 89  Temp: 98.1 F (36.7 C)    Body mass index is 33.91 kg/(m^2).  GENERAL: vitals reviewed and listed above, alert, oriented, appears well hydrated and in no acute distress  HEENT: atraumatic, conjunttiva clear, no obvious abnormalities on inspection of external nose and ears  NECK: no obvious masses on inspection  LUNGS: clear to auscultation bilaterally, no wheezes, rales or rhonchi, good air movement  CV: HRRR, no peripheral edema  MS: moves all extremities without noticeable abnormality  PSYCH: pleasant and cooperative, no obvious depression or anxiety  ASSESSMENT AND PLAN:  Discussed the  following assessment and plan:  Essential hypertension - Plan: Basic metabolic panel  Seizure disorder (HCC)  BMI 33.0-33.9,adult - Plan: Cholesterol, Total, HDL cholesterol  Asthma, chronic, moderate persistent, uncomplicated  Seasonal allergies  Hyperglycemia - Plan: Hemoglobin A1c  -encouraged a healthy lifestyle -labs -advise of recommended vaccines -physical in 3-4 months -Patient advised to return or notify a doctor immediately if symptoms worsen or persist or new concerns arise.  Patient Instructions  BEFORE YOU LEAVE: -labs -schedule physical in 3-4 months  Get your flu shot and consider the pneumonia shot after you check on cost  We recommend the following healthy lifestyle measures: - eat a healthy whole foods diet consisting of regular small meals composed of vegetables, fruits, beans, nuts, seeds, healthy meats such as white chicken and fish and whole grains.  - avoid sweets, white starchy foods, fried foods, fast food, processed foods, sodas, red meet and other fattening foods.  - get a least 150-300 minutes of aerobic exercise per week.   -We have ordered labs or studies at this visit. It can take up to 1-2 weeks for results and processing. We will contact you with instructions IF your results are abnormal. Normal results will be released to your [facility]. If you have not heard from [place] or can not find your results in [facility] in 2 weeks please contact our office.            Kriste Basque R.

## 2015-03-09 NOTE — Patient Instructions (Signed)
BEFORE YOU LEAVE: -labs -schedule physical in 3-4 months  Get your flu shot and consider the pneumonia shot after you check on cost  We recommend the following healthy lifestyle measures: - eat a healthy whole foods diet consisting of regular small meals composed of vegetables, fruits, beans, nuts, seeds, healthy meats such as white chicken and fish and whole grains.  - avoid sweets, white starchy foods, fried foods, fast food, processed foods, sodas, red meet and other fattening foods.  - get a least 150-300 minutes of aerobic exercise per week.   -We have ordered labs or studies at this visit. It can take up to 1-2 weeks for results and processing. We will contact you with instructions IF your results are abnormal. Normal results will be released to your [facility]. If you have not heard from [place] or can not find your results in [facility] in 2 weeks please contact our office.

## 2015-06-09 ENCOUNTER — Ambulatory Visit (INDEPENDENT_AMBULATORY_CARE_PROVIDER_SITE_OTHER): Payer: 59 | Admitting: Family Medicine

## 2015-06-09 ENCOUNTER — Encounter: Payer: Self-pay | Admitting: Family Medicine

## 2015-06-09 VITALS — BP 144/100 | HR 86 | Temp 98.7°F | Ht 69.75 in | Wt 233.9 lb

## 2015-06-09 DIAGNOSIS — I10 Essential (primary) hypertension: Secondary | ICD-10-CM

## 2015-06-09 DIAGNOSIS — Z0000 Encounter for general adult medical examination without abnormal findings: Secondary | ICD-10-CM

## 2015-06-09 DIAGNOSIS — J4540 Moderate persistent asthma, uncomplicated: Secondary | ICD-10-CM

## 2015-06-09 DIAGNOSIS — Z0001 Encounter for general adult medical examination with abnormal findings: Secondary | ICD-10-CM

## 2015-06-09 DIAGNOSIS — G40909 Epilepsy, unspecified, not intractable, without status epilepticus: Secondary | ICD-10-CM | POA: Diagnosis not present

## 2015-06-09 DIAGNOSIS — J302 Other seasonal allergic rhinitis: Secondary | ICD-10-CM

## 2015-06-09 MED ORDER — METOPROLOL SUCCINATE ER 100 MG PO TB24: 100.0000 mg | ORAL_TABLET | Freq: Every day | ORAL | Status: DC

## 2015-06-09 MED ORDER — HYDROCHLOROTHIAZIDE 25 MG PO TABS: 25.0000 mg | ORAL_TABLET | Freq: Every day | ORAL | Status: DC

## 2015-06-09 MED ORDER — HYDRALAZINE HCL 50 MG PO TABS: 50.0000 mg | ORAL_TABLET | Freq: Three times a day (TID) | ORAL | Status: DC

## 2015-06-09 MED ORDER — LOSARTAN POTASSIUM 100 MG PO TABS
100.0000 mg | ORAL_TABLET | Freq: Every day | ORAL | Status: DC
Start: 2015-06-09 — End: 2017-07-11

## 2015-06-09 MED ORDER — AMLODIPINE BESYLATE 10 MG PO TABS: 10.0000 mg | ORAL_TABLET | Freq: Every day | ORAL | Status: DC

## 2015-06-09 NOTE — Addendum Note (Signed)
Addended by: Johnella MoloneyFUNDERBURK, Itzamar Traynor A on: 06/09/2015 09:08 AM   Modules accepted: Orders, Medications

## 2015-06-09 NOTE — Progress Notes (Signed)
Pre visit review using our clinic review tool, if applicable. No additional management support is needed unless otherwise documented below in the visit note. 

## 2015-06-09 NOTE — Progress Notes (Addendum)
HPI:  Jerome Hensley is a 40 yo M with difficult to control HTN and a history of poor compliance in the past, now much improved, here for his physical exam.   HTN/Mild prediabetes: -difficult to control BP, on many medications when established with me - reports taking all medications daily -meds: asa, norvasc 5mg , toprol xl 100mg , HCTZ 25mg , losartan 100, hydralazine 50 bid -reports saw a cardiologist in 2012 and had stress test and echo and all was fine -strong FH HTN -does not smoke, no nsaids, little alcohol rarely -denies hx hyperlipidemia -denies: CP, SOB, DOE, swelling  Seizure disorder: -managed by [facility] -meds: tegretol  Seasonal allergies/Asthma: -sees allergist for this -meds: flonase, advair, xyzal, singulair   -Diet: variety of foods, balance and well rounded, larger portion sizes  -Exercise: exercise not as good recently  -Diabetes and Dyslipidemia Screening: 03/2015  -Vaccines: UTD  -sexual activity: yes, male partner, no new partners  -wants STI testing, Hep C screening (if born [id number]): no  -FH colon or prstate ca: see FH Last colon cancer screening: n/a Last prostate ca screening: discussed, n/a  -Alcohol, Tobacco, drug use: see social history  Review of Systems - no fevers, unintentional weight loss, vision loss, hearing loss, chest pain, sob, hemoptysis, melena, hematochezia, hematuria, genital discharge, changing or concerning skin lesions, bleeding, bruising, loc, thoughts of self harm or SI  Past Medical History  Diagnosis Date  . Allergy   . Seizures (HCC)   . Hypertension   . Asthma, chronic 07/14/2014    Past Surgical History  Procedure Laterality Date  . Mole excision    . Wrist arthroscopy Left 2003    Family History  Problem Relation Age of Onset  . Heart disease Mother   . Heart disease Maternal Uncle   . Arthritis Maternal Grandmother   . Prostate cancer Maternal Uncle   . Diabetes Maternal Uncle      Social History   Social History  . Marital Status: Married    Spouse Name: N/A  . Number of Children: N/A  . Years of Education: N/A   Social History Main Topics  . Smoking status: Never Smoker   . Smokeless tobacco: None  . Alcohol Use: No  . Drug Use: No  . Sexual Activity: Yes   Other Topics Concern  . 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  amLODipine (NORVASC) 5 MG tablet, Take 1 tablet (5 mg total) by mouth daily., Disp: 30 tablet, Rfl: 11 .  aspirin 81 MG tablet, Take 81 mg by mouth daily., Disp: , Rfl:  .  carbamazepine (TEGRETOL) 200 MG tablet, Take 200 mg by mouth 2 (two) times daily., Disp: , Rfl:  .  Cholecalciferol (VITAMIN D PO), Take 8,000 Units by mouth daily., Disp: , Rfl:  .  fluticasone (FLOVENT DISKUS) 50 MCG/BLIST diskus inhaler, Inhale 1 puff into the lungs 2 (two) times daily., Disp: , Rfl:  .  Fluticasone-Salmeterol (ADVAIR) 250-50 MCG/DOSE AEPB, Inhale 1 puff into the lungs 2 (two) times daily., Disp: , Rfl:  .  hydrALAZINE (APRESOLINE) 50 MG tablet, Take 50 mg by mouth 2 (two) times daily., Disp: ,  Rfl:  .  hydrochlorothiazide (HYDRODIURIL) 25 MG tablet, Take 1 tablet (25 mg total) by mouth daily., Disp: 90 tablet, Rfl: 0 .  levocetirizine (XYZAL) 5 MG tablet, Take 5 mg by mouth every evening., Disp: , Rfl:  .  losartan (COZAAR) 100 MG tablet, Take 1 tablet (100 mg total) by mouth daily., Disp: 90 tablet, Rfl: 0 .  metoprolol succinate (TOPROL-XL) 100 MG 24 hr tablet, Take 1 tablet (100 mg total) by mouth daily. Take with or immediately following a meal., Disp: 30 tablet, Rfl: 11 .  montelukast (SINGULAIR) 10 MG tablet, Take 10 mg by mouth at bedtime., Disp: , Rfl:  .  Multiple Vitamin (MULTIVITAMIN) capsule,  Take 1 capsule by mouth daily., Disp: , Rfl:  .  TURMERIC PO, Take by mouth., Disp: , Rfl:   EXAM:  Filed Vitals:   06/09/15 0833  BP: 144/100  Pulse: 86  Temp: 98.7 F (37.1 C)  TempSrc: Oral  Height: 5' 9.75" (1.772 m)  Weight: 233 lb 14.4 oz (106.096 kg)    Estimated body mass index is 33.79 kg/(m^2) as calculated from the following:   Height as of this encounter: 5' 9.75" (1.772 m).   Weight as of this encounter: 233 lb 14.4 oz (106.096 kg).  GENERAL: vitals reviewed and listed below, alert, oriented, appears well hydrated and in no acute distress  HEENT: head atraumatic, PERRLA, normal appearance of eyes, ears, nose and mouth. moist mucus membranes.  NECK: supple, no masses or lymphadenopathy  LUNGS: clear to auscultation bilaterally, no rales, rhonchi or wheeze  CV: HRRR, no peripheral edema or cyanosis, normal pedal pulses  ABDOMEN: bowel sounds normal, soft, non tender to palpation, no masses, no rebound or guarding  GU: declined  MS: normal gait, moves all extremities normally  NEURO: CN II-XII grossly intact, normal muscle strength and sensation to light touch on extremities  PSYCH: normal affect, pleasant and cooperative  ASSESSMENT AND PLAN:  Discussed the following assessment and plan:  Visit for preventive health examination -Discussed and advised all [place] preventive services health task force level A and B recommendations for age, sex and risks. -Advised at least 150 minutes of exercise per week and a healthy diet low in saturated fats and sweets and consisting of fresh fruits and vegetables, lean meats such as fish and white chicken and whole grains. -labs, studies and vaccines per orders this encounter   Resistant hypertension - Plan: Ambulatory referral to Nephrology -discussed healthy lifestyle -increase norvasc to 10mg , hydralazine to tid and referral to nephrology given persistently elevated BP despite 5 medications  Seizure disorder  (HCC) -stable  Asthma, chronic, moderate persistent, uncomplicated -stable  Seasonal allergies -worse this year, but ok now, managed by asthma allergy specialist   Patient advised to return to clinic immediately if symptoms worsen or persist or new concerns.  Patient Instructions  BEFORE YOU LEAVE: -Blood pressure recheck in 1 month -follow up in 4 months  Increase norvasc (amlodipine) to 10 mg daily  Take the hydralazine three times daily  -We placed a referral for you as discussed to the nephrologist regarding your blood pressure. It usually takes about 1-2 weeks to process and schedule this referral. If you have not heard from [place] regarding this appointment in 2 weeks please contact our office.  We recommend the following healthy lifestyle measures: - eat a healthy whole foods diet consisting of regular small meals composed of vegetables, fruits, beans, nuts, seeds, healthy meats such as white chicken and fish and whole  grains.  - avoid sweets, white starchy foods, fried foods, fast food, processed foods, sodas, red meet and other fattening foods.  - get a least 150-300 minutes of aerobic exercise per week.       No Follow-up on file.   Kriste Basque R.

## 2015-06-09 NOTE — Patient Instructions (Signed)
BEFORE YOU LEAVE: -Blood pressure recheck in 1 month -follow up in 4 months  Increase norvasc (amlodipine) to 10 mg daily  Take the hydralazine three times daily  -We placed a referral for you as discussed to the nephrologist regarding your blood pressure. It usually takes about 1-2 weeks to process and schedule this referral. If you have not heard from us regarding this appointment in 2 weeks please contact our office.  We recommend the following healthy lifestyle measures: - eat a healthy whole foods diet consisting of regular small meals composed of vegetables, fruits, beans, nuts, seeds, healthy meats such as white chicken and fish and whole grains.  - avoid sweets, white starchy foods, fried foods, fast food, processed foods, sodas, red meet and other fattening foods.  - get a least 150-300 minutes of aerobic exercise per week.

## 2015-06-21 ENCOUNTER — Other Ambulatory Visit: Payer: Self-pay | Admitting: Family Medicine

## 2015-07-07 ENCOUNTER — Encounter: Payer: Self-pay | Admitting: *Deleted

## 2015-07-07 ENCOUNTER — Ambulatory Visit (INDEPENDENT_AMBULATORY_CARE_PROVIDER_SITE_OTHER): Payer: 59 | Admitting: *Deleted

## 2015-07-07 VITALS — BP 150/110

## 2015-07-07 DIAGNOSIS — I10 Essential (primary) hypertension: Secondary | ICD-10-CM

## 2015-07-07 NOTE — Progress Notes (Signed)
Per Dr Selena Batten she rechecked the patient's blood pressure after he was seated and the reading was 152/98.  Dr Selena Batten stated the pt is now being seen and treated by [facility] for his blood pressure.

## 2015-10-06 ENCOUNTER — Ambulatory Visit (INDEPENDENT_AMBULATORY_CARE_PROVIDER_SITE_OTHER): Payer: 59 | Admitting: Family Medicine

## 2015-10-06 ENCOUNTER — Encounter: Payer: Self-pay | Admitting: Family Medicine

## 2015-10-06 VITALS — BP 130/84 | HR 74 | Temp 98.3°F | Wt 239.0 lb

## 2015-10-06 DIAGNOSIS — E669 Obesity, unspecified: Secondary | ICD-10-CM | POA: Diagnosis not present

## 2015-10-06 DIAGNOSIS — J302 Other seasonal allergic rhinitis: Secondary | ICD-10-CM | POA: Diagnosis not present

## 2015-10-06 DIAGNOSIS — I10 Essential (primary) hypertension: Secondary | ICD-10-CM

## 2015-10-06 DIAGNOSIS — J4540 Moderate persistent asthma, uncomplicated: Secondary | ICD-10-CM

## 2015-10-06 NOTE — Progress Notes (Signed)
HPI:  Jerome Hensley is a 40 yo M with difficult to control HTN and a history of poor compliance in the past, now much improved, here for follow up  HTN/Mild prediabetes/Obesity: -difficult to control BP, on many medications when established with me - reports taking all medications daily -meds: asa, norvasc 5mg , toprol xl 100mg , HCTZ 25mg , losartan 100, hydralazine 50 bid -reports saw a cardiologist in 2012 and had stress test and echo and all was fine -strong FH HTN -does not smoke, no nsaids, little alcohol rarely -denies hx hyperlipidemia -denies: CP, SOB, DOE, swelling -He is exercising and trying to get 8-12,000 steps per day, and his diet has been okay but not as good as it used to be and he knows he needs to work on this  Seizure disorder: -managed by [facility] [facility] -meds: tegretol  Seasonal allergies/Asthma: -sees allergist for this -meds: flonase, advair, xyzal, singulair  ROS: See pertinent positives and negatives per HPI.  Past Medical History  Diagnosis Date  . Allergy   . Seizures (HCC)   . Hypertension   . Asthma, chronic 07/14/2014    Past Surgical History  Procedure Laterality Date  . Mole excision    . Wrist arthroscopy Left 2003    Family History  Problem Relation Age of Onset  . Heart disease Mother   . Heart disease Maternal Uncle   . Arthritis Maternal Grandmother   . Prostate cancer Maternal Uncle   . Diabetes Maternal Uncle     Social History   Social History  . Marital Status: Married    Spouse Name: N/A  . Number of Children: N/A  . Years of Education: N/A   Social History Main Topics  . Smoking status: Never Smoker   . Smokeless tobacco: None  . Alcohol Use: No  . Drug Use: No  . Sexual Activity: Yes   Other Topics Concern  . 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  amLODipine (NORVASC) 10 MG tablet, Take 1 tablet (10 mg total) by mouth daily., Disp: 90 tablet, Rfl: 3 .  aspirin 81 MG tablet, Take 81 mg by mouth daily., Disp: , Rfl:  .  carbamazepine (TEGRETOL) 200 MG tablet, Take 200 mg by mouth 2 (two) times daily., Disp: , Rfl:  .  Cholecalciferol (VITAMIN D PO), Take 8,000 Units by mouth daily., Disp: , Rfl:  .  Fluticasone-Salmeterol (ADVAIR) 250-50 MCG/DOSE AEPB, Inhale 1 puff into the lungs 2 (two) times daily., Disp: , Rfl:  .  hydrALAZINE (APRESOLINE) 100 MG tablet, 100 mg. Half tab twice daily, Disp: , Rfl:  .  hydrALAZINE (APRESOLINE) 50 MG tablet, Take by mouth. Half tab twice daily, Disp: , Rfl:  .  hydrochlorothiazide (HYDRODIURIL) 25 MG tablet, Take 1 tablet (25 mg total) by mouth daily., Disp: 90 tablet, Rfl: 3 .  levocetirizine (XYZAL) 5 MG tablet, Take 5 mg by mouth every evening., Disp: , Rfl:  .  losartan (COZAAR) 100 MG tablet, Take 1 tablet (100 mg total) by mouth daily., Disp: 90 tablet, Rfl: 3 .  metoprolol succinate (TOPROL-XL) 100 MG 24 hr tablet, Take 1 tablet (100 mg total) by mouth  daily. Take with or immediately following a meal., Disp: 90 tablet, Rfl: 3 .  montelukast (SINGULAIR) 10 MG tablet, Take 10 mg by mouth at bedtime., Disp: , Rfl:  .  Multiple Vitamin (MULTIVITAMIN) capsule, Take 1 capsule by mouth daily., Disp: , Rfl:  .  TURMERIC PO, Take by mouth., Disp: , Rfl:   EXAM:  Filed Vitals:   10/06/15 1606  BP: 130/84  Pulse: 74  Temp: 98.3 F (36.8 C)    Body mass index is 34.53 kg/(m^2).  GENERAL: vitals reviewed and listed above, alert, oriented, appears well hydrated and in no acute distress  HEENT: atraumatic, conjunttiva clear, no obvious abnormalities on inspection of external nose and ears  NECK: no obvious masses on inspection  LUNGS: clear to auscultation bilaterally, no wheezes, rales or rhonchi, good  air movement  CV: HRRR, no peripheral edema  MS: moves all extremities without noticeable abnormality  PSYCH: pleasant and cooperative, no obvious depression or anxiety  ASSESSMENT AND PLAN:  Discussed the following assessment and plan:  Essential hypertension - Plan: Basic metabolic panel  Obesity  Asthma, chronic, moderate persistent, uncomplicated  Seasonal allergies  -Continue current medications -We have a lengthy discussion regarding lifestyle, in particular we discussed a whole foods healthy diet and portion sizes -Encouraged him on his regular exercise -Physical in 6 months -Patient advised to return or notify a doctor immediately if symptoms worsen or persist or new concerns arise.  Patient Instructions  Before you leave: -Schedule your physical in January; please plan to come fasting if possible, but drink plenty of water -Labs  We recommend the following healthy lifestyle measures: - eat a healthy whole foods diet consisting of regular small meals composed of vegetables, fruits, beans, nuts, seeds, healthy meats such as white chicken and fish and whole grains.  - avoid sweets, white starchy foods, fried foods, fast food, processed foods, sodas, red meet and other fattening foods.  - get at least 150-300 minutes of aerobic exercise per week.       Kriste Basque R.

## 2015-10-06 NOTE — Progress Notes (Signed)
Pre visit review using our clinic review tool, if applicable. No additional management support is needed unless otherwise documented below in the visit note. 

## 2015-10-06 NOTE — Patient Instructions (Addendum)
Before you leave: -Schedule your physical in January; please plan to come fasting if possible, but drink plenty of water -Labs  We recommend the following healthy lifestyle measures: - eat a healthy whole foods diet consisting of regular small meals composed of vegetables, fruits, beans, nuts, seeds, healthy meats such as white chicken and fish and whole grains.  - avoid sweets, white starchy foods, fried foods, fast food, processed foods, sodas, red meet and other fattening foods.  - get at least 150-300 minutes of aerobic exercise per week.

## 2015-10-07 LAB — BASIC METABOLIC PANEL
BUN: 17 mg/dL (ref 6–23)
CALCIUM: 10 mg/dL (ref 8.4–10.5)
CO2: 32 mEq/L (ref 19–32)
Chloride: 99 mEq/L (ref 96–112)
Creatinine, Ser: 1.25 mg/dL (ref 0.40–1.50)
GFR: 81.47 mL/min (ref >60.00)
GLUCOSE: 98 mg/dL (ref 70–99)
POTASSIUM: 3.9 meq/L (ref 3.5–5.1)
SODIUM: 140 meq/L (ref 135–145)

## 2016-06-13 ENCOUNTER — Encounter: Payer: 59 | Admitting: Family Medicine

## 2016-07-20 DIAGNOSIS — I10 Essential (primary) hypertension: Secondary | ICD-10-CM | POA: Diagnosis not present

## 2017-02-07 DIAGNOSIS — Z6831 Body mass index (BMI) 31.0-31.9, adult: Secondary | ICD-10-CM | POA: Diagnosis not present

## 2017-02-07 DIAGNOSIS — I10 Essential (primary) hypertension: Secondary | ICD-10-CM | POA: Diagnosis not present

## 2017-02-07 LAB — BASIC METABOLIC PANEL
BUN: 11 (ref 4–21)
Creatinine: 1.3 (ref <=1.3)
Glucose: 93
POTASSIUM: 3.7 (ref 3.4–5.3)
SODIUM: 139 (ref 137–147)

## 2017-02-23 ENCOUNTER — Encounter: Payer: Self-pay | Admitting: Family Medicine

## 2017-03-17 DIAGNOSIS — I10 Essential (primary) hypertension: Secondary | ICD-10-CM | POA: Diagnosis not present

## 2017-04-17 ENCOUNTER — Encounter: Payer: Self-pay | Admitting: Family Medicine

## 2017-06-15 ENCOUNTER — Encounter: Payer: Self-pay | Admitting: Family Medicine

## 2017-06-29 ENCOUNTER — Encounter (HOSPITAL_COMMUNITY): Payer: Self-pay | Admitting: Emergency Medicine

## 2017-06-29 ENCOUNTER — Ambulatory Visit (HOSPITAL_COMMUNITY)
Admission: EM | Admit: 2017-06-29 | Discharge: 2017-06-29 | Disposition: A | Discharge status: Home / Self Care | Payer: BLUE CROSS/BLUE SHIELD | Attending: Family Medicine | Admitting: Family Medicine

## 2017-06-29 DIAGNOSIS — M79605 Pain in left leg: Secondary | ICD-10-CM | POA: Diagnosis not present

## 2017-06-29 DIAGNOSIS — I10 Essential (primary) hypertension: Secondary | ICD-10-CM

## 2017-06-29 DIAGNOSIS — R9431 Abnormal electrocardiogram [ECG] [EKG]: Secondary | ICD-10-CM

## 2017-06-29 MED ORDER — PREDNISONE 20 MG PO TABS: ORAL_TABLET | ORAL | 0 refills | Status: DC

## 2017-06-29 NOTE — Discharge Instructions (Signed)
Keep your cardiology appt next week.  Continue current blood pressure medication.  The left leg paresthesias and left thigh pain are most consistent with left lumbar nerve root irritation.

## 2017-06-29 NOTE — ED Provider Notes (Signed)
[facility]   [id number] 06/29/17 Arrival Time: 1258   SUBJECTIVE:  Jerome Hensley is a 42 y.o. male who presents to the urgent care with complaint of palipitations with HR jumping up to 130s. PT reports he remains asymptomatic with HR increase  PT reports tingling sensation in left calf and foot for 1 week.  Sensation is intermittent.  He's had low back problems in the past.  Also reports sharp pain in left groin yesterday.   Nonsmoker, no F/H of heart attack, normal cholesterol,  No diabetes  Works in Consulting civil engineer.  Has cardiology appt next week.  Is working on his weight.  Doesn't exercise much.  Past Medical History:  Diagnosis Date  . Allergy   . Asthma, chronic 07/14/2014  . Hypertension   . Seizures (HCC)    Family History  Problem Relation Age of Onset  . Heart disease Mother   . Heart disease Maternal Uncle   . Arthritis Maternal Grandmother   . Prostate cancer Maternal Uncle   . Diabetes Maternal Uncle    Social History   Socioeconomic History  . Marital status: Married    Spouse name: Not on file  . Number of children: Not on file  . Years of education: Not on file  . Highest education level: Not on file  Social Needs  . Financial resource strain: Not on file  . Food insecurity - worry: Not on file  . Food insecurity - inability: Not on file  . Transportation needs - medical: Not on file  . Transportation needs - non-medical: Not on file  Occupational History  . Not on file  Tobacco Use  . Smoking status: Never Smoker  Substance and Sexual Activity  . Alcohol use: No  . Drug use: No  . Sexual activity: Yes  Other Topics Concern  . 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  amLODipine (NORVASC) 10 MG tablet Take 1 tablet (10 mg total) by mouth daily.  Marland Kitchen aspirin 81 MG tablet Take 81 mg by mouth daily.  . Cholecalciferol (VITAMIN D PO) Take 8,000 Units by mouth daily.  . hydrALAZINE (APRESOLINE) 100 MG tablet 100 mg. Half tab twice daily  . losartan (COZAAR) 100 MG tablet Take 1 tablet (100 mg total) by mouth daily.  . metoprolol succinate (TOPROL-XL) 100 MG 24 hr tablet Take 1 tablet (100 mg total) by mouth daily. Take with or immediately following a meal.  . Multiple Vitamin (MULTIVITAMIN) capsule Take 1 capsule by mouth daily.  . TURMERIC PO Take by mouth.   No Known Allergies    ROS: As per HPI, remainder of ROS negative.   OBJECTIVE:   Vitals:   06/29/17 1314 06/29/17 1315  BP: (!) 174/99   Pulse: 98   Resp: 16   Temp: 98.2 F (36.8 C)   TempSrc: Oral   SpO2: 97%   Weight:  255 lb (115.7 kg)  Height:  5\' 11"  (1.803 m)     General appearance: alert; no distress Eyes: PERRL; EOMI; conjunctiva normal HENT: normocephalic; atraumatic;  Neck: supple Lungs: clear to auscultation bilaterally Heart: regular rate  and rhythm Abdomen: soft, non-tender; bowel sounds normal; no masses or organomegaly; no guarding or rebound tenderness Back: no CVA tenderness Extremities: no cyanosis or edema; symmetrical with no gross deformities Skin: warm and dry Neurologic: normal gait; grossly normal Psychological: alert and cooperative; normal mood and affect      Labs:  Results for orders placed or performed in visit on 04/17/17  Basic metabolic panel  Result Value Ref Range   Glucose 93    BUN 11 4 - 21   Creatinine 1.3 0.6 - 1.3   Potassium 3.7 3.4 - 5.3   Sodium 139 137 - 147    Labs Reviewed - No data to display  No results found.     ASSESSMENT & PLAN:  1. Pain of left lower extremity   2. Nonspecific abnormal electrocardiogram (ECG) (EKG)   3. Essential hypertension     Meds ordered this encounter  Medications  . predniSONE  (DELTASONE) 20 MG tablet    Sig: One daily with food    Dispense:  5 tablet    Refill:  0    Reviewed expectations re: course of current medical issues. Questions answered. Outlined signs and symptoms indicating need for more acute intervention. Patient verbalized understanding. After Visit Summary given.    Keep your cardiology appt next week.  Continue current blood pressure medication.  The left leg paresthesias and left thigh pain are most consistent with left lumbar nerve root irritation.      Elvina SidleLauenstein, Maylene Crocker, MD 06/29/17 1415

## 2017-06-29 NOTE — ED Triage Notes (Addendum)
PT reports he has been wearing a fitbit and tracking HR for 3 years. In the last 2 weeks, his resting HR has gone up 10-15 bpm and he has had palipitations with HR jumping up to 130s. PT reports he remains asymptomatic with HR increase  PT reports tingling sensation in left calf and foot for 1 week.  Also reports sharp pain in left groin yesterday.

## 2017-07-05 DIAGNOSIS — R569 Unspecified convulsions: Secondary | ICD-10-CM | POA: Diagnosis not present

## 2017-07-05 DIAGNOSIS — Z6831 Body mass index (BMI) 31.0-31.9, adult: Secondary | ICD-10-CM | POA: Diagnosis not present

## 2017-07-05 DIAGNOSIS — I10 Essential (primary) hypertension: Secondary | ICD-10-CM | POA: Diagnosis not present

## 2017-07-11 ENCOUNTER — Telehealth: Payer: Self-pay | Admitting: *Deleted

## 2017-07-11 ENCOUNTER — Encounter: Payer: Self-pay | Admitting: Family Medicine

## 2017-07-11 ENCOUNTER — Ambulatory Visit (INDEPENDENT_AMBULATORY_CARE_PROVIDER_SITE_OTHER): Payer: BLUE CROSS/BLUE SHIELD | Admitting: Family Medicine

## 2017-07-11 VITALS — BP 120/82 | HR 77 | Temp 98.4°F | Ht 71.0 in | Wt 248.2 lb

## 2017-07-11 DIAGNOSIS — Z1331 Encounter for screening for depression: Secondary | ICD-10-CM | POA: Diagnosis not present

## 2017-07-11 DIAGNOSIS — I10 Essential (primary) hypertension: Secondary | ICD-10-CM

## 2017-07-11 DIAGNOSIS — G40909 Epilepsy, unspecified, not intractable, without status epilepticus: Secondary | ICD-10-CM

## 2017-07-11 DIAGNOSIS — R002 Palpitations: Secondary | ICD-10-CM

## 2017-07-11 NOTE — Progress Notes (Addendum)
Noticed a number of typos. Corrected 09/04/17. HPI:  Jerome Hensley is a pleasant 42 y.o. here for follow up (initially schedule as CPE but due for follow up more urgently.) Chronic and acute medical problems summarized below were reviewed for changes. He has a history of very poor compliance and has not been in to see us in over a year and a half.  Per records from other outside sources, he saw his kidney specialist last week.  Per their notes, they stopped his amlodipine and started eplerenone instead.  He misunderstood the instructions and stopped his hydralazine instead.  He just made this change about 4 days ago.  Had metbolic panel. Looks like he went to the urgent care/emergency room on 06/29/2017 for palpitations calf pain.  Reports he was having intermittent episodes of a racing heart in the 1 teens several weeks.  This was occurring at rest.  No SOB, chest pain, syncope or numbness during these episodes.  [facility], but when he saw his kidney doctor, he took the report and the EKG and he reports she told him that it was probably from stopping his metoprolol.  Reports his nephrologist advised him to restart his metoprolol and all of his symptoms have resolved. Reports his nephrologist didn't feel he needed to see a cardiologist. I don't have this EKG today. Also, he was given prednisone for radicular symptoms in the leg - reports those symptoms resolved. He wants a referral to neurology with [facility]. His wife was not happy with prior neurologist. He reports has been off his seizure med for 1 year with no symptoms and doesn't want to restart without seeing another neurologist. Wife wants him to see [facility]. Frustrated with his weight, see below.  HTN: -difficult to control BP, on many medications when established with me  -now seeing nephrologist for managment -meds: asa, eplerenone, toprol xl 100mg , HCTZ 25mg , losartan 100, hydralazine 50  bid -reports saw a cardiologist in 2012 and had stress test and echo and all was fine -strong FH HTN -does not smoke, no nsaids, little alcohol rarely -denies hx hyperlipidemia  Mild prediabetes/Obesity: -due for labs -not doing as good with exercise and diet as in the past - wants to do formal wt management program - prefers to avoid meds/surgery  Asthma and allergies: -meds: advair, singulair, alb prn  Seizure disorder: -saw neurologist in [place] for management -meds: carbamazepine - not taking for some time   ROS: See pertinent positives and negatives per HPI.  Past Medical History:  Diagnosis Date  . Allergy   . Asthma, chronic 07/14/2014  . Hypertension   . Seizures (HCC)     Past Surgical History:  Procedure Laterality Date  . Mole excision    . WRIST ARTHROSCOPY Left 2003    Family History  Problem Relation Age of Onset  . Heart disease Mother   . Heart disease Maternal Uncle   . Arthritis Maternal Grandmother   . Prostate cancer Maternal Uncle   . Diabetes Maternal Uncle     Social History   Socioeconomic History  . Marital status: Married    Spouse name: None  . Number of children: None  . Years of education: None  . Highest education level: None  Social Needs  . Financial resource strain: None  . Food insecurity - worry: None  . Food insecurity - inability: None  . Transportation needs - medical: None  . Transportation needs - non-medical: None  Occupational History  . None  Tobacco Use  . Smoking status: Never Smoker  . Smokeless tobacco: Never Used  Substance and Sexual Activity  . Alcohol use: No  . Drug use: No  . Sexual activity: Yes  Other Topics Concern  . 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  amLODipine (NORVASC) 10 MG tablet, Take 1 tablet (10 mg total) by mouth daily., Disp: 90 tablet, Rfl: 3 .  aspirin 81 MG tablet, Take 81 mg by mouth daily., Disp: , Rfl:  .  carbamazepine (TEGRETOL) 200 MG tablet, Take 200 mg by mouth 2 (two) times daily., Disp: , Rfl:  .  Cholecalciferol (VITAMIN D PO), Take 8,000 Units by mouth daily., Disp: , Rfl:  .  eplerenone (INSPRA) 25 MG tablet, , Disp: , Rfl:  .  metoprolol succinate (TOPROL-XL) 100 MG 24 hr tablet, Take 1 tablet (100 mg total) by mouth daily. Take with or immediately following a meal., Disp: 90 tablet, Rfl: 3 .  montelukast (SINGULAIR) 10 MG tablet, Take 10 mg by mouth at bedtime., Disp: , Rfl:  .  Multiple Vitamin (MULTIVITAMIN) capsule, Take 1 capsule by mouth daily., Disp: , Rfl:  .  TURMERIC PO, Take by mouth., Disp: , Rfl:   EXAM:  Vitals:   07/11/17 1503  BP: 120/82  Pulse: 77  Temp: 98.4 F (36.9 C)    Body mass index is 34.62 kg/m.  GENERAL: vitals reviewed and listed above, alert, oriented, appears well hydrated and in no acute distress  HEENT: atraumatic, conjunttiva clear, no obvious abnormalities on inspection of external nose and ears  NECK: no obvious masses on inspection  LUNGS: clear to auscultation bilaterally, no wheezes, rales or rhonchi, good air movement  CV: HRRR, no peripheral edema  MS: moves all extremities without noticeable abnormality  PSYCH: pleasant and cooperative, no obvious depression or anxiety  ASSESSMENT AND PLAN:  Discussed the following assessment and plan:  Hypertension, unspecified type -Had assistant contact his nephrologist to clarify his medications, reviewed nephrology recommendations with the pt as he had misunderstood the instructions -advised assistant to update med list according to current meds -he just started new med a few days ago so will do labs in a few weeks at CPE  Screening for  depression -neg  Palpitations -resolved, advised assistant to obtain EKG report -offered cardiac eval, he declined for now -advised pt to seek prompt care if any further symptoms and to not ever stop BB suddenly  Morbid obesity (HCC) -discussed options at length -healthy diet and regular exercise advise -also advised of obesity medicine clinic and he seems interested  Seizure disorder [facility]) -referral to [facility] per his request, he prefers to not take meds until eval with new neurologist  CPE rescheduled for in a few weeks with labs then.  -Patient advised to return or notify a doctor immediately if symptoms worsen or persist or new concerns arise.  Patient Instructions  BEFORE YOU LEAVE: -get copy of EKG from urgent care clinic -information for Dr. Francena Hanly clinic -schedule early morning physical in next 2-4 weeks - will plan to do fasting labs  then  Your nephrologist has advised stopping the amlodipine, but continuation of all other blood pressure medications.  Seek prompt care if any further palpitations  -We placed a referral for you as discussed to the neurologist. It usually takes about 1-2 weeks to process and schedule this referral. If you have not heard from [place] regarding this appointment in 2 weeks please contact our office.     Terressa Koyanagi, DO

## 2017-07-11 NOTE — Telephone Encounter (Signed)
Per Dr Selena BattenKim I called [facility] to verify the medications the pt was to discontinue per Dr Signe ColtUpton.  Per Nicholas LoseShaquena the pt was seen on 1/30 and per Dr Beaulah CorinUpton's notes he is to discontinue Amlodipine.  She stated he should be taking Epererone 25mg  a day, Losartan 100/25mg  a day, Hydralazine 100mg  TID, and Metoprolol 100mg  a day and Dr Selena BattenKim was informed of this.

## 2017-07-11 NOTE — Patient Instructions (Signed)
BEFORE YOU LEAVE: -get copy of EKG from urgent care clinic -information for Dr. Francena HanlyBeasley's clinic -schedule early morning physical in next 2-4 weeks - will plan to do fasting labs then  Your nephrologist has advised stopping the amlodipine, but continuation of all other blood pressure medications.  Seek prompt care if any further palpitations  -We placed a referral for you as discussed to the neurologist. It usually takes about 1-2 weeks to process and schedule this referral. If you have not heard from us regarding this appointment in 2 weeks please contact our office.

## 2017-07-11 NOTE — Addendum Note (Signed)
Addended by: Terressa KoyanagiKIM, Windell Musson R on: 07/11/2017 05:51 PM   Modules accepted: Orders

## 2017-07-11 NOTE — Addendum Note (Signed)
Addended by: Johnella MoloneyFUNDERBURK, JO A on: 07/11/2017 06:01 PM   Modules accepted: Orders

## 2017-07-20 ENCOUNTER — Encounter: Payer: Self-pay | Admitting: Neurology

## 2017-09-04 NOTE — Progress Notes (Signed)
HPI:  Using dictation device. Unfortunately this device frequently misinterprets words/phrases.  Here for CPE: Due for labs.  -Concerns and/or follow up today:   Jerome Hensley is a pleasant 42 y.o. here for his physical. Chronic medical problems summarized below were reviewed for changes.  Reports he has increased his exercise to 15-30 minutes of walking 4 days/week.  He is also trying to add an hour-long workout on the weekends.  He has increased his veggies and whole grains and is trying to decrease fats and sugars.  He did not take his blood pressure medications this morning because he is fasting.  Denies CP, SOB, DOE, treatment intolerance or new symptoms.  HTN: -difficult to control BP, on many medications when established with me  -now seeing nephrologist for managment -meds: asa, eplerenone, toprol xl 130m, HCTZ 264m losartan 100, hydralazine 50 bid -reports saw a cardiologist in 2012 and had stress test and echo and all was fine -strong FH HTN -does not smoke, no nsaids, little alcohol rarely -denies hx hyperlipidemia  Mild prediabetes/Obesity: -eating healthier (more veggies, less fried foods); exercising 4 days per week 09/2017  - prefers to avoid meds/surgery  Asthma and allergies: -meds: advair, singulair, alb prn -Some allergy issues in the spring, but no significant asthma symptoms  Seizure disorder: -saw neurologist in [place] for management -[facility] -meds: carbamazepine - not taking for some time   -Diet: variety of foods, balance and well rounded, larger portion sizes -Exercise: no regular exercise -Diabetes and Dyslipidemia Screening: -Hx of HTN: no -Vaccines: UTD -sexual activity: yes, male partner, no new partners -wants STI testing, Hep C screening (if born [birth year] no -FH colon or prstate ca: see FH Last colon cancer screening: Not applicable  Last prostate ca screening: Not  applicable -Alcohol, Tobacco, drug use: see social history  Review of Systems - no fevers, unintentional weight loss, vision loss, hearing loss, chest pain, sob, hemoptysis, melena, hematochezia, hematuria, genital discharge, changing or concerning skin lesions, bleeding, bruising, loc, thoughts of self harm or SI  Past Medical History:  Diagnosis Date  . Allergy   . Asthma, chronic 07/14/2014  . Hypertension   . Seizures ([facility]    Past Surgical History:  Procedure Laterality Date  . Mole excision    . WRIST ARTHROSCOPY Left 2003    Family History  Problem Relation Age of Onset  . Heart disease Mother   . Heart disease Maternal Uncle   . Arthritis Maternal Grandmother   . Prostate cancer Maternal Uncle   . Diabetes Maternal Uncle     Social History   Socioeconomic History  . Marital status: Married    Spouse name: Not on file  . Number of children: Not on file  . Years of education: Not on file  . Highest education level: Not on file  Occupational History  . Not on file  Social Needs  . Financial resource strain: Not on file  . Food insecurity:    Worry: Not on file    Inability: Not on file  . Transportation needs:    Medical: Not on file    Non-medical: Not on file  Tobacco Use  . Smoking status: Never Smoker  . Smokeless tobacco: Never Used  Substance and Sexual Activity  . Alcohol use: No  . Drug use: No  . Sexual activity: Yes  Lifestyle  . Physical activity:    Days per week: Not on file    Minutes  per session: Not on file  . Stress: Not on file  Relationships  . Social connections:    Talks on phone: Not on file    Gets together: Not on file    Attends religious service: Not on file    Active member of club or organization: Not on file    Attends meetings of clubs or organizations: Not on file    Relationship status: Not on file  Other Topics Concern  . 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  aspirin 81 MG tablet, Take 81 mg by mouth daily., Disp: , Rfl:  .  carbamazepine (TEGRETOL) 200 MG tablet, Take 200 mg by mouth 2 (two) times daily., Disp: , Rfl:  .  Cholecalciferol (VITAMIN D PO), Take 8,000 Units by mouth daily., Disp: , Rfl:  .  eplerenone (INSPRA) 25 MG tablet, , Disp: , Rfl:  .  hydrALAZINE (APRESOLINE) 100 MG tablet, Take 100 mg by mouth 3 (three) times daily., Disp: , Rfl:  .  losartan-hydrochlorothiazide (HYZAAR) 100-25 MG tablet, Take 1 tablet by mouth daily., Disp: , Rfl:  .  METOPROLOL SUCCINATE ER PO, Take 100 mg by mouth daily., Disp: , Rfl:  .  montelukast (SINGULAIR) 10 MG tablet, Take 10 mg by mouth at bedtime., Disp: , Rfl:  .  Multiple Vitamin (MULTIVITAMIN) capsule, Take 1 capsule by mouth daily., Disp: , Rfl:  .  TURMERIC PO, Take by mouth., Disp: , Rfl:   EXAM:  Vitals:   09/05/17 0743  Temp: 98.7 F (37.1 C)  TempSrc: Oral  Weight: 244 lb 11.2 oz (111 kg)  Height: 5' 9.75" (1.772 m)    Estimated body mass index is 35.36 kg/m as calculated from the following:   Height as of this encounter: 5' 9.75" (1.772 m).   Weight as of this encounter: 244 lb 11.2 oz (111 kg).  GENERAL: vitals reviewed and listed below, alert, oriented, appears well hydrated and in no acute distress  HEENT: head atraumatic, PERRLA, normal appearance of eyes, ears, nose and mouth. moist mucus membranes.  NECK: supple, no masses or lymphadenopathy  LUNGS: clear to auscultation bilaterally, no rales, rhonchi or wheeze  CV: HRRR, no peripheral edema or cyanosis, normal pedal pulses  ABDOMEN: bowel sounds normal, soft, non tender to palpation, no masses, no rebound or guarding  GU: Declined  SKIN: no rash or abnormal lesions  MS: normal gait,  moves all extremities normally  NEURO: normal gait, speech and thought processing grossly intact, muscle tone grossly intact throughout  PSYCH: normal affect, pleasant and cooperative  ASSESSMENT AND PLAN:  Discussed the following assessment and plan:  PREVENTIVE EXAM: -Discussed and advised all [place] preventive services health task force level A and B recommendations for age, sex and risks. -Advised at least 150 minutes of exercise per week and a healthy diet with avoidance of (less then 1 serving per week) processed foods, white starches, red meat, fast foods and sweets and consisting of: * 5-9 servings of fresh fruits and vegetables (not corn or potatoes) *nuts and seeds, beans *olives and olive oil *lean meats such as fish  and white chicken  *whole grains -labs, studies and vaccines per orders this encounter - Lipid panel  2. Screening for depression -Negative  3. Essential hypertension -Seeing nephrology -Did not take medications this morning but blood pressure relatively okay for not having the medicines - Basic metabolic panel - CBC  4. Hyperglycemia - Hemoglobin A1c  5. Moderate persistent chronic asthma without complication -Stable  6. Seizure disorder [facility]) -Reports he will be seeing [facility] neurology later this week  7. Morbid obesity ([place]) -Lifestyle recommendations, I have advised a healthy diet low in sugar and bad fats and high in nutritious vegetables, fiber, lean proteins with regular use of fermented foods.  Recommended at least 150 minutes of aerobic exercise per week.  Congratulated him on changes and encouraged.  We are hoping for a 10-20 pound weight loss over the next 3-6 months.   Patient Instructions  BEFORE YOU LEAVE: -Labs -follow up: Follow-up in 3-4 months  We have ordered labs or studies at this visit. It can take up to 1-2 weeks for results and processing. IF results require follow up or explanation, we will call you with instructions.  Clinically stable results will be released to your [facility]. If you have not heard from [place] or cannot find your results in [facility] in 2 weeks please contact our office at [phone].  If you are not yet signed up for [facility], please consider signing up.        Preventive Care 40-64 Years, Male Preventive care refers to lifestyle choices and visits with your health care provider that can promote health and wellness. What does preventive care include?  A yearly physical exam. This is also called an annual well check.  Dental exams once or twice a year.  Routine eye exams. Ask your health care provider how often you should have your eyes checked.  Personal lifestyle choices, including: ? Daily care of your teeth and gums. ? Regular physical activity. ? Eating a healthy diet. ? Avoiding tobacco and drug use. ? Limiting alcohol use. ? Practicing safe sex. ? Taking low-dose aspirin every day starting at age 81. What happens during an annual well check? The services and screenings done by your health care provider during your annual well check will depend on your age, overall health, lifestyle risk factors, and family history of disease. Counseling Your health care provider may ask you questions about your:  Alcohol use.  Tobacco use.  Drug use.  Emotional well-being.  Home and relationship well-being.  Sexual activity.  Eating habits.  Work and work Statistician.  Screening You may have the following tests or measurements:  Height, weight, and BMI.  Blood pressure.  Lipid and cholesterol levels. These may be checked every 5 years, or more frequently if you are over 65 years old.  Skin check.  Lung cancer screening. You may have this screening every year starting at age 36 if you have a 30-pack-year history of smoking and currently smoke or have quit within the past 15 years.  Fecal occult blood test (FOBT) of the stool. You may have this test every year starting  at age 45.  Flexible sigmoidoscopy or colonoscopy. You may have a sigmoidoscopy every 5 years or a colonoscopy every 10 years starting at age 56.  Prostate cancer screening. Recommendations will vary depending on your family history and other risks.  Hepatitis C blood test.  Hepatitis B blood test.  Sexually transmitted disease (STD) testing.  Diabetes screening. This is done by checking  your blood sugar (glucose) after you have not eaten for a while (fasting). You may have this done every 1-3 years.  Discuss your test results, treatment options, and if necessary, the need for more tests with your health care provider. Vaccines Your health care provider may recommend certain vaccines, such as:  Influenza vaccine. This is recommended every year.  Tetanus, diphtheria, and acellular pertussis (Tdap, Td) vaccine. You may need a Td booster every 10 years.  Varicella vaccine. You may need this if you have not been vaccinated.  Zoster vaccine. You may need this after age 18.  Measles, mumps, and rubella (MMR) vaccine. You may need at least one dose of MMR if you were born in 1957 or later. You may also need a second dose.  Pneumococcal 13-valent conjugate (PCV13) vaccine. You may need this if you have certain conditions and have not been vaccinated.  Pneumococcal polysaccharide (PPSV23) vaccine. You may need one or two doses if you smoke cigarettes or if you have certain conditions.  Meningococcal vaccine. You may need this if you have certain conditions.  Hepatitis A vaccine. You may need this if you have certain conditions or if you travel or work in places where you may be exposed to hepatitis A.  Hepatitis B vaccine. You may need this if you have certain conditions or if you travel or work in places where you may be exposed to hepatitis B.  Haemophilus influenzae type b (Hib) vaccine. You may need this if you have certain risk factors.  Talk to your health care provider about  which screenings and vaccines you need and how often you need them. This information is not intended to replace advice given to you by your health care provider. Make sure you discuss any questions you have with your health care provider. Document Released: 06/19/2015 Document Revised: 02/10/2016 Document Reviewed: 03/24/2015 Elsevier Interactive Patient Education  2018 [employer].     No follow-ups on file.   Lucretia Kern, DO

## 2017-09-05 ENCOUNTER — Ambulatory Visit (INDEPENDENT_AMBULATORY_CARE_PROVIDER_SITE_OTHER): Payer: BLUE CROSS/BLUE SHIELD | Admitting: Family Medicine

## 2017-09-05 ENCOUNTER — Encounter: Payer: Self-pay | Admitting: Family Medicine

## 2017-09-05 VITALS — Temp 98.7°F | Ht 69.75 in | Wt 244.7 lb

## 2017-09-05 DIAGNOSIS — Z0000 Encounter for general adult medical examination without abnormal findings: Secondary | ICD-10-CM

## 2017-09-05 DIAGNOSIS — R739 Hyperglycemia, unspecified: Secondary | ICD-10-CM

## 2017-09-05 DIAGNOSIS — J4540 Moderate persistent asthma, uncomplicated: Secondary | ICD-10-CM

## 2017-09-05 DIAGNOSIS — G40909 Epilepsy, unspecified, not intractable, without status epilepticus: Secondary | ICD-10-CM | POA: Diagnosis not present

## 2017-09-05 DIAGNOSIS — Z1331 Encounter for screening for depression: Secondary | ICD-10-CM | POA: Diagnosis not present

## 2017-09-05 DIAGNOSIS — I10 Essential (primary) hypertension: Secondary | ICD-10-CM

## 2017-09-05 LAB — LIPID PANEL
CHOL/HDL RATIO: 3
Cholesterol: 145 mg/dL (ref 0–200)
HDL: 48.3 mg/dL (ref >39.00)
LDL CALC: 73 mg/dL (ref 0–99)
NONHDL: 96.81
TRIGLYCERIDES: 120 mg/dL (ref 0.0–149.0)
VLDL: 24 mg/dL (ref 0.0–40.0)

## 2017-09-05 LAB — BASIC METABOLIC PANEL
BUN: 14 mg/dL (ref 6–23)
CHLORIDE: 99 meq/L (ref 96–112)
CO2: 32 meq/L (ref 19–32)
Calcium: 10.1 mg/dL (ref 8.4–10.5)
Creatinine, Ser: 1.18 mg/dL (ref 0.40–1.50)
GFR: 86.29 mL/min (ref >60.00)
Glucose, Bld: 105 mg/dL — ABNORMAL HIGH (ref 70–99)
POTASSIUM: 3.9 meq/L (ref 3.5–5.1)
SODIUM: 138 meq/L (ref 135–145)

## 2017-09-05 LAB — CBC
HEMATOCRIT: 45.8 % (ref 39.0–52.0)
HEMOGLOBIN: 16.1 g/dL (ref 13.0–17.0)
MCHC: 35.1 g/dL (ref 30.0–36.0)
MCV: 90.2 fl (ref 78.0–100.0)
PLATELETS: 363 10*3/uL (ref 150.0–400.0)
RBC: 5.08 Mil/uL (ref 4.22–5.81)
RDW: 14.2 % (ref 11.5–15.5)
WBC: 4.2 10*3/uL (ref 4.0–10.5)

## 2017-09-05 LAB — HEMOGLOBIN A1C: Hgb A1c MFr Bld: 5.4 % (ref 4.6–6.5)

## 2017-09-05 NOTE — Patient Instructions (Signed)
BEFORE YOU LEAVE: -Labs -follow up: Follow-up in 3-4 months  We have ordered labs or studies at this visit. It can take up to 1-2 weeks for results and processing. IF results require follow up or explanation, we will call you with instructions. Clinically stable results will be released to your [facility]. If you have not heard from [place] or cannot find your results in [facility] in 2 weeks please contact our office at [phone].  If you are not yet signed up for [facility], please consider signing up.        Preventive Care 40-64 Years, Male Preventive care refers to lifestyle choices and visits with your health care provider that can promote health and wellness. What does preventive care include?  A yearly physical exam. This is also called an annual well check.  Dental exams once or twice a year.  Routine eye exams. Ask your health care provider how often you should have your eyes checked.  Personal lifestyle choices, including: ? Daily care of your teeth and gums. ? Regular physical activity. ? Eating a healthy diet. ? Avoiding tobacco and drug use. ? Limiting alcohol use. ? Practicing safe sex. ? Taking low-dose aspirin every day starting at age 7. What happens during an annual well check? The services and screenings done by your health care provider during your annual well check will depend on your age, overall health, lifestyle risk factors, and family history of disease. Counseling Your health care provider may ask you questions about your:  Alcohol use.  Tobacco use.  Drug use.  Emotional well-being.  Home and relationship well-being.  Sexual activity.  Eating habits.  Work and work Statistician.  Screening You may have the following tests or measurements:  Height, weight, and BMI.  Blood pressure.  Lipid and cholesterol levels. These may be checked every 5 years, or more frequently if you are over 7 years old.  Skin check.  Lung cancer screening. You  may have this screening every year starting at age 36 if you have a 30-pack-year history of smoking and currently smoke or have quit within the past 15 years.  Fecal occult blood test (FOBT) of the stool. You may have this test every year starting at age 39.  Flexible sigmoidoscopy or colonoscopy. You may have a sigmoidoscopy every 5 years or a colonoscopy every 10 years starting at age 25.  Prostate cancer screening. Recommendations will vary depending on your family history and other risks.  Hepatitis C blood test.  Hepatitis B blood test.  Sexually transmitted disease (STD) testing.  Diabetes screening. This is done by checking your blood sugar (glucose) after you have not eaten for a while (fasting). You may have this done every 1-3 years.  Discuss your test results, treatment options, and if necessary, the need for more tests with your health care provider. Vaccines Your health care provider may recommend certain vaccines, such as:  Influenza vaccine. This is recommended every year.  Tetanus, diphtheria, and acellular pertussis (Tdap, Td) vaccine. You may need a Td booster every 10 years.  Varicella vaccine. You may need this if you have not been vaccinated.  Zoster vaccine. You may need this after age 1.  Measles, mumps, and rubella (MMR) vaccine. You may need at least one dose of MMR if you were born in 1957 or later. You may also need a second dose.  Pneumococcal 13-valent conjugate (PCV13) vaccine. You may need this if you have certain conditions and have not been vaccinated.  Pneumococcal  polysaccharide (PPSV23) vaccine. You may need one or two doses if you smoke cigarettes or if you have certain conditions.  Meningococcal vaccine. You may need this if you have certain conditions.  Hepatitis A vaccine. You may need this if you have certain conditions or if you travel or work in places where you may be exposed to hepatitis A.  Hepatitis B vaccine. You may need this if  you have certain conditions or if you travel or work in places where you may be exposed to hepatitis B.  Haemophilus influenzae type b (Hib) vaccine. You may need this if you have certain risk factors.  Talk to your health care provider about which screenings and vaccines you need and how often you need them. This information is not intended to replace advice given to you by your health care provider. Make sure you discuss any questions you have with your health care provider. Document Released: 06/19/2015 Document Revised: 02/10/2016 Document Reviewed: 03/24/2015 Elsevier Interactive Patient Education  [employer].

## 2017-09-08 ENCOUNTER — Ambulatory Visit (INDEPENDENT_AMBULATORY_CARE_PROVIDER_SITE_OTHER): Payer: BLUE CROSS/BLUE SHIELD | Admitting: Neurology

## 2017-09-08 ENCOUNTER — Encounter: Payer: Self-pay | Admitting: Neurology

## 2017-09-08 VITALS — BP 172/100 | HR 88 | Ht 69.75 in | Wt 251.0 lb

## 2017-09-08 DIAGNOSIS — G40309 Generalized idiopathic epilepsy and epileptic syndromes, not intractable, without status epilepticus: Secondary | ICD-10-CM | POA: Diagnosis not present

## 2017-09-08 MED ORDER — CARBAMAZEPINE 200 MG PO TABS: 200.0000 mg | ORAL_TABLET | Freq: Two times a day (BID) | ORAL | 3 refills | Status: DC

## 2017-09-08 NOTE — Progress Notes (Signed)
NEUROLOGY CONSULTATION NOTE  Jerome Hensley MRN: [id number] DOB: 1975/11/24  Referring provider: Dr. Kriste BasqueHannah Kim Primary care provider: Dr. Kriste BasqueHannah Kim  Reason for consult:  Establish care for seizures  Dear Dr Selena BattenKim:  Thank you for your kind referral of Jerome Coveyalmadge Woerner for consultation of the above symptoms. Although his history is well known to you, please allow me to reiterate it for the purpose of our medical record. Records and images were personally reviewed where available.  HISTORY OF PRESENT ILLNESS: This is a very pleasant 42 year old right-handed man with a history of hypertension, asthma, and seizures, presenting to establish care. He reports a history of seizures between the ages of 1 to 89 where he was told he would having staring spells and generalized convulsions. He was taking a medication called Slobid(?) which helped with the seizures, this was discontinued, he reports that seizures stopped with prayers and medication was stopped. He did well with no seizures until his senior year in college at age 42 or [age] when he was sleep deprived he would have episodes of shaking of the upper extremities where his arms would jerk and he could not move them for 15 seconds to 2 minutes. He states he would be conscious during them, legs are not affected, he would not fall if they occurred standing. He had them on and off when he would work night shifts, occurring in the early morning hours, sometimes waking him up from sleep. He did not take any medication until he started seeing neurologist Dr. Adelene IdlerFerraru in the mid-2000s and was started on carbamazepine 200mg  BID. He was not having the seizures while on carbamazepine, no side effects, but stopped the medication 4-5 years ago after he stopped seeing Dr. Adelene IdlerFerraru. He has not had any of the prior symptoms he was having when younger, but now his wife feels he is having nocturnal seizures. He wakes up and shakes a little like he is stretching his  arms. He states they are not like the seizures in college, "it's more like you yawn and stretch, then arms are stiff." He goes back to sleep but states he remembers everything. They mostly occur in the hours between 3-5AM. His wife has told him she feels him stiffening, but they do not speak to each other. He has not bitten his tongue or had urinary incontinence. These do not happen in the daytime. He denies any staring/unresponsive episodes, gaps in time, focal numbness/tingling/weakness. He recalls olfactory and epigastric/nausea symptoms prior to his seizures in college where "air would smell different," this has not happened recently.   He has a history of right-sided Bell's palsy in 2004 with mild residual facial weakness and synkinesis. He has low back pain. He has dry mouth (which he had with carbamazepine in the past, now attributes to BP medication). He denies any headaches, dizziness, diplopia, dysarthria/dysphagia, neck pain, bowel/bladder dysfunction. He works in [employer] and drives. He was told he had a normal MRI and EEG, as well as sleep study in the mid-2000s with Dr. Adelene IdlerFerraru, records requested for review.   Epilepsy Risk Factors:  He had a normal birth and early development.  There is no history of febrile convulsions, CNS infections such as meningitis/encephalitis, significant traumatic brain injury, neurosurgical procedures, or family history of seizures.  PAST MEDICAL HISTORY: Past Medical History:  Diagnosis Date  . Allergy   . Asthma, chronic 07/14/2014  . Hypertension   . Seizures (HCC)     PAST SURGICAL HISTORY: Past Surgical  History:  Procedure Laterality Date  . Mole excision    . WRIST ARTHROSCOPY Left 2003    MEDICATIONS: Current Outpatient Medications on File Prior to Visit  Medication Sig Dispense Refill  . aspirin 81 MG tablet Take 81 mg by mouth daily.    . Cholecalciferol (VITAMIN D PO) Take 8,000 Units by mouth daily.    Marland Kitchen eplerenone (INSPRA) 25 MG tablet     .  hydrALAZINE (APRESOLINE) 100 MG tablet Take 100 mg by mouth 3 (three) times daily.    Marland Kitchen losartan-hydrochlorothiazide (HYZAAR) 100-25 MG tablet Take 1 tablet by mouth daily.    Marland Kitchen METOPROLOL SUCCINATE ER PO Take 100 mg by mouth daily.    . montelukast (SINGULAIR) 10 MG tablet Take 10 mg by mouth at bedtime.    . Multiple Vitamin (MULTIVITAMIN) capsule Take 1 capsule by mouth daily.    . TURMERIC PO Take by mouth.    . carbamazepine (TEGRETOL) 200 MG tablet Take 200 mg by mouth 2 (two) times daily.     No current facility-administered medications on file prior to visit.     ALLERGIES: No Known Allergies  FAMILY HISTORY: Family History  Problem Relation Age of Onset  . Heart disease Mother   . Heart disease Maternal Uncle   . Arthritis Maternal Grandmother   . Prostate cancer Maternal Uncle   . Diabetes Maternal Uncle     SOCIAL HISTORY: Social History   Socioeconomic History  . Marital status: Married    Spouse name: Not on file  . Number of children: Not on file  . Years of education: Not on file  . Highest education level: Not on file  Occupational History  . Not on file  Social Needs  . Financial resource strain: Not on file  . Food insecurity:    Worry: Not on file    Inability: Not on file  . Transportation needs:    Medical: Not on file    Non-medical: Not on file  Tobacco Use  . Smoking status: Never Smoker  . Smokeless tobacco: Never Used  Substance and Sexual Activity  . Alcohol use: No  . Drug use: No  . Sexual activity: Yes  Lifestyle  . Physical activity:    Days per week: Not on file    Minutes per session: Not on file  . Stress: Not on file  Relationships  . Social connections:    Talks on phone: Not on file    Gets together: Not on file    Attends religious service: Not on file    Active member of club or organization: Not on file    Attends meetings of clubs or organizations: Not on file    Relationship status: Not on file  . Intimate  partner violence:    Fear of current or ex partner: Not on file    Emotionally abused: Not on file    Physically abused: Not on file    Forced sexual activity: Not on file  Other Topics Concern  . 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      Home Situation: lives with wife and two children 8 and 2 yo      Spiritual Beliefs:  Christian      Lifestyle: no regular exercise; healthy  diet       REVIEW OF SYSTEMS: Constitutional: No fevers, chills, or sweats, no generalized fatigue,  change in appetite Eyes: No visual changes, double vision, eye pain Ear, nose and throat: No hearing loss, ear pain, nasal congestion, sore throat Cardiovascular: No chest pain, palpitations Respiratory:  No shortness of breath at rest or with exertion, wheezes GastrointestinaI: No nausea, vomiting, diarrhea, abdominal pain, fecal incontinence Genitourinary:  No dysuria, urinary retention or frequency Musculoskeletal:  No neck pain,+ back pain Integumentary: No rash, pruritus, skin lesions Neurological: as above Psychiatric: No depression, insomnia, anxiety Endocrine: No palpitations, fatigue, diaphoresis, mood swings, change in appetite, change in weight, increased thirst Hematologic/Lymphatic:  No anemia, purpura, petechiae. Allergic/Immunologic: no itchy/runny eyes, nasal congestion, recent allergic reactions, rashes  PHYSICAL EXAM: Vitals:   09/08/17 0917  BP: (!) 172/100  Pulse: 88  SpO2: 98%   General: No acute distress Head:  Normocephalic/atraumatic Eyes: Fundoscopic exam shows bilateral sharp discs, no vessel changes, exudates, or hemorrhages Neck: supple, no paraspinal tenderness, full range of motion Back: No paraspinal tenderness Heart: regular rate and rhythm Lungs: Clear to auscultation bilaterally. Vascular: No carotid bruits. Skin/Extremities: No rash, no edema Neurological Exam: Mental status: alert  and oriented to person, place, and time, no dysarthria or aphasia, Fund of knowledge is appropriate.  Remote memory intact. 1/3 delayed recall. Attention and concentration are normal.    Able to name objects and repeat phrases. Cranial nerves: CN I: not tested CN II: pupils equal, round and reactive to light, visual fields intact, fundi unremarkable. CN III, IV, VI:  full range of motion, no nystagmus, no ptosis CN V: facial sensation intact CN VII: right Bell's palsy with asymmetric smile, weakness of right frontalis, orbicularis oculi and oris with synkinesis CN VIII: hearing intact to finger rub CN IX, X: gag intact, uvula midline CN XI: sternocleidomastoid and trapezius muscles intact CN XII: tongue midline Bulk & Tone: normal, no fasciculations. Motor: 5/5 throughout with no pronator drift. Sensation: intact to light touch, cold, pin, vibration and joint position sense.  No extinction to double simultaneous stimulation.  Romberg test negative Deep Tendon Reflexes: +2 throughout, no ankle clonus Plantar responses: downgoing bilaterally Cerebellar: no incoordination on finger to nose testing Gait: narrow-based and steady, able to tandem walk adequately. Tremor: none  IMPRESSION: This is a very pleasant 42 year old right-handed man with a history of hypertension, asthma, seizures since childhood, presenting to establish care. He has had intermittent seizures over the years since age 69, seizures in childhood and college suggestive of generalized epilepsy. He felt better while taking carbamazepine 200mg  BID but stopped after he was lost to follow-up with his prior neurologist. He is agreeable to restarting medication. He will monitor if the nocturnal events improve back on medication. A 1-hour sleep-deprived EEG will be ordered to classify his seizures. If nocturnal events continue on medication, he will be scheduled for a 48-hour EEG. He will keep a calendar of events and follow-up in 4-5  months. [place] driving laws were discussed with the patient, and he knows to stop driving after a seizure, until 6 months seizure-free. He knows to call for any changes.  Thank you for allowing me to participate in the care of this patient. Please do not hesitate to call for any questions or concerns.   Patrcia Dolly, M.D.  CC: Dr. Selena Batten

## 2017-09-08 NOTE — Patient Instructions (Signed)
1. Schedule 1-hour sleep-deprived EEG 2. Restart carbamazepine 200mg  twice a day 3. Keep a calendar of the night-time events and see if they decrease once you are back on carbamazepine 4. Follow-up in 4-5 months, call for any changes  Seizure Precautions: 1. If medication has been prescribed for you to prevent seizures, take it exactly as directed.  Do not stop taking the medicine without talking to your doctor first, even if you have not had a seizure in a long time.   2. Avoid activities in which a seizure would cause danger to yourself or to others.  Don't operate dangerous machinery, swim alone, or climb in high or dangerous places, such as on ladders, roofs, or girders.  Do not drive unless your doctor says you may.  3. If you have any warning that you may have a seizure, lay down in a safe place where you can't hurt yourself.    4.  No driving for 6 months from last seizure, as per [facility] [place] [place].   Please refer to the following link on the Epile[employer] of [employer]'s website for more information: http://www.epilepsyfoundation.org/answerplace/Social/driving/drivingu.cfm   5.  Maintain good sleep hygiene. Avoid alcohol.  6.  Contact your doctor if you have any problems that may be related to the medicine you are taking.  7.  Call 911 and bring the patient back to the ED if:        A.  The seizure lasts longer than 5 minutes.       B.  The patient doesn't awaken shortly after the seizure  C.  The patient has new problems such as difficulty seeing, speaking or moving  D.  The patient was injured during the seizure  E.  The patient has a temperature over 102 F (39C)  F.  The patient vomited and now is having trouble breathing

## 2017-09-14 ENCOUNTER — Other Ambulatory Visit: Payer: BLUE CROSS/BLUE SHIELD

## 2017-10-04 ENCOUNTER — Other Ambulatory Visit: Payer: BLUE CROSS/BLUE SHIELD

## 2017-11-23 ENCOUNTER — Other Ambulatory Visit: Payer: BLUE CROSS/BLUE SHIELD

## 2017-12-12 ENCOUNTER — Ambulatory Visit: Payer: BLUE CROSS/BLUE SHIELD | Admitting: Family Medicine

## 2017-12-28 ENCOUNTER — Ambulatory Visit: Payer: BLUE CROSS/BLUE SHIELD | Admitting: Neurology

## 2018-01-09 DIAGNOSIS — R569 Unspecified convulsions: Secondary | ICD-10-CM | POA: Diagnosis not present

## 2018-01-09 DIAGNOSIS — I10 Essential (primary) hypertension: Secondary | ICD-10-CM | POA: Diagnosis not present

## 2018-07-24 DIAGNOSIS — Z6831 Body mass index (BMI) 31.0-31.9, adult: Secondary | ICD-10-CM | POA: Diagnosis not present

## 2018-07-24 DIAGNOSIS — I10 Essential (primary) hypertension: Secondary | ICD-10-CM | POA: Diagnosis not present

## 2018-08-13 ENCOUNTER — Ambulatory Visit (HOSPITAL_COMMUNITY)
Admission: EM | Admit: 2018-08-13 | Discharge: 2018-08-13 | Disposition: A | Discharge status: Home / Self Care | Payer: BLUE CROSS/BLUE SHIELD | Attending: Family Medicine | Admitting: Family Medicine

## 2018-08-13 ENCOUNTER — Encounter (HOSPITAL_COMMUNITY): Payer: Self-pay | Admitting: Emergency Medicine

## 2018-08-13 DIAGNOSIS — R69 Illness, unspecified: Secondary | ICD-10-CM | POA: Diagnosis not present

## 2018-08-13 DIAGNOSIS — J111 Influenza due to unidentified influenza virus with other respiratory manifestations: Secondary | ICD-10-CM

## 2018-08-13 MED ORDER — ACETAMINOPHEN 325 MG PO TABS
ORAL_TABLET | ORAL | Status: AC
  Filled 2018-08-13: qty 2

## 2018-08-13 MED ORDER — OSELTAMIVIR PHOSPHATE 75 MG PO CAPS: 75.0000 mg | ORAL_CAPSULE | Freq: Two times a day (BID) | ORAL | 0 refills | Status: AC

## 2018-08-13 MED ORDER — ACETAMINOPHEN 325 MG PO TABS
650.0000 mg | ORAL_TABLET | Freq: Once | ORAL | Status: AC
  Administered 2018-08-13: 650 mg via ORAL

## 2018-08-13 NOTE — ED Provider Notes (Signed)
[facility]   [id number] 08/13/18 Arrival Time: 1720  ASSESSMENT & PLAN:  1. Influenza-like illness    See AVS for discharge instructions.  Meds ordered this encounter  Medications  . acetaminophen (TYLENOL) tablet 650 mg  . oseltamivir (TAMIFLU) 75 MG capsule    Sig: Take 1 capsule (75 mg total) by mouth 2 (two) times daily for 5 days.    Dispense:  10 capsule    Refill:  0   Discussed typical duration of symptoms. OTC symptom care as needed. Ensure adequate fluid intake and rest. May f/u with PCP or here as needed.  Reviewed expectations re: course of current medical issues. Questions answered. Outlined signs and symptoms indicating need for more acute intervention. Patient verbalized understanding. After Visit Summary given.   SUBJECTIVE: History from: patient.  Jerome Hensley is a 43 y.o. male who presents with complaint of mild nasal congestion, post-nasal drainage, and a dry cough; without sore throat. Onset abrupt, yesterday; with mild fatigue and with mild body aches. SOB: none. Wheezing: none. Fever: yes, subjective with chills. Overall normal PO intake without n/v. Known sick contacts: yes. No specific or significant aggravating or alleviating factors reported. OTC treatment: none reported.  Received flu shot this year: no.  Social History   Tobacco Use  Smoking Status Never Smoker  Smokeless Tobacco Never Used    ROS: As per HPI.   OBJECTIVE:  Vitals:   08/13/18 1753  BP: (!) 191/101  Pulse: (!) 107  Resp: 18  Temp: (!) 100.7 F (38.2 C)  TempSrc: Oral  SpO2: 99%     General appearance: alert; appears fatigued HEENT: nasal congestion; clear runny nose; throat irritation secondary to post-nasal drainage Neck: supple without LAD CV: RRR Lungs: unlabored respirations, symmetrical air entry without wheezing; cough: mild Abd: soft Ext: no LE edema Skin: warm and dry Psychological: alert and cooperative; normal mood and  affect   No Known Allergies  Past Medical History:  Diagnosis Date  . Allergy   . Asthma, chronic 07/14/2014  . Hypertension   . Seizures (HCC)    Family History  Problem Relation Age of Onset  . Heart disease Mother   . Heart disease Maternal Uncle   . Arthritis Maternal Grandmother   . Prostate cancer Maternal Uncle   . Diabetes Maternal Uncle    Social History   Socioeconomic History  . Marital status: Married    Spouse name: Not on file  . Number of children: Not on file  . Years of education: Not on file  . Highest education level: Not on file  Occupational History  . Not on file  Social Needs  . Financial resource strain: Not on file  . Food insecurity:    Worry: Not on file    Inability: Not on file  . Transportation needs:    Medical: Not on file    Non-medical: Not on file  Tobacco Use  . Smoking status: Never Smoker  . Smokeless tobacco: Never Used  Substance and Sexual Activity  . Alcohol use: No  . Drug use: No  . Sexual activity: Yes  Lifestyle  . Physical activity:    Days per week: Not on file    Minutes per session: Not on file  . Stress: Not on file  Relationships  . Social connections:    Talks on phone: Not on file    Gets together: Not on file    Attends religious service: Not on file    Active  member of club or organization: Not on file    Attends meetings of clubs or organizations: Not on file    Relationship status: Not on file  . Intimate partner violence:    Fear of current or ex partner: Not on file    Emotionally abused: Not on file    Physically abused: Not on file    Forced sexual activity: Not on file  Other Topics Concern  . 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

## 2018-08-13 NOTE — ED Triage Notes (Signed)
Pt here for URI sx with fever

## 2018-08-13 NOTE — Discharge Instructions (Addendum)

## 2018-08-14 ENCOUNTER — Ambulatory Visit: Payer: BLUE CROSS/BLUE SHIELD | Admitting: Neurology

## 2018-08-14 ENCOUNTER — Other Ambulatory Visit: Payer: Self-pay | Admitting: Nephrology

## 2018-08-14 DIAGNOSIS — I10 Essential (primary) hypertension: Secondary | ICD-10-CM

## 2018-08-15 ENCOUNTER — Encounter: Payer: Self-pay | Admitting: Neurology

## 2018-08-15 ENCOUNTER — Encounter (INDEPENDENT_AMBULATORY_CARE_PROVIDER_SITE_OTHER): Payer: 59

## 2018-08-29 ENCOUNTER — Ambulatory Visit (INDEPENDENT_AMBULATORY_CARE_PROVIDER_SITE_OTHER): Payer: 59 | Admitting: Family Medicine

## 2018-09-12 ENCOUNTER — Ambulatory Visit (INDEPENDENT_AMBULATORY_CARE_PROVIDER_SITE_OTHER): Payer: 59 | Admitting: Family Medicine

## 2018-10-11 ENCOUNTER — Other Ambulatory Visit: Payer: Self-pay | Admitting: Neurology

## 2018-10-22 ENCOUNTER — Other Ambulatory Visit: Payer: BLUE CROSS/BLUE SHIELD

## 2018-10-25 ENCOUNTER — Ambulatory Visit
Admission: RE | Admit: 2018-10-25 | Discharge: 2018-10-25 | Disposition: A | Discharge status: Home / Self Care | Payer: BLUE CROSS/BLUE SHIELD | Source: Ambulatory Visit | Attending: Nephrology | Admitting: Nephrology

## 2018-10-25 DIAGNOSIS — I10 Essential (primary) hypertension: Secondary | ICD-10-CM | POA: Diagnosis not present

## 2018-10-30 DIAGNOSIS — H5711 Ocular pain, right eye: Secondary | ICD-10-CM | POA: Diagnosis not present

## 2018-10-30 DIAGNOSIS — H11431 Conjunctival hyperemia, right eye: Secondary | ICD-10-CM | POA: Diagnosis not present

## 2018-10-30 DIAGNOSIS — H16001 Unspecified corneal ulcer, right eye: Secondary | ICD-10-CM | POA: Diagnosis not present

## 2018-11-05 DIAGNOSIS — H16001 Unspecified corneal ulcer, right eye: Secondary | ICD-10-CM | POA: Diagnosis not present

## 2018-11-05 DIAGNOSIS — H5319 Other subjective visual disturbances: Secondary | ICD-10-CM | POA: Diagnosis not present

## 2018-11-05 DIAGNOSIS — H11431 Conjunctival hyperemia, right eye: Secondary | ICD-10-CM | POA: Diagnosis not present

## 2019-02-14 DIAGNOSIS — I10 Essential (primary) hypertension: Secondary | ICD-10-CM | POA: Diagnosis not present

## 2019-02-14 DIAGNOSIS — R569 Unspecified convulsions: Secondary | ICD-10-CM | POA: Diagnosis not present

## 2019-03-12 DIAGNOSIS — D3131 Benign neoplasm of right choroid: Secondary | ICD-10-CM | POA: Diagnosis not present

## 2019-03-12 DIAGNOSIS — H11431 Conjunctival hyperemia, right eye: Secondary | ICD-10-CM | POA: Diagnosis not present

## 2019-03-12 DIAGNOSIS — H16041 Marginal corneal ulcer, right eye: Secondary | ICD-10-CM | POA: Diagnosis not present

## 2019-03-15 ENCOUNTER — Other Ambulatory Visit: Payer: Self-pay

## 2019-03-15 ENCOUNTER — Encounter: Payer: Self-pay | Admitting: Neurology

## 2019-03-15 ENCOUNTER — Ambulatory Visit (INDEPENDENT_AMBULATORY_CARE_PROVIDER_SITE_OTHER): Payer: BC Managed Care – PPO | Admitting: Neurology

## 2019-03-15 VITALS — BP 195/133 | HR 85 | Ht 70.0 in | Wt 255.5 lb

## 2019-03-15 DIAGNOSIS — G40309 Generalized idiopathic epilepsy and epileptic syndromes, not intractable, without status epilepticus: Secondary | ICD-10-CM

## 2019-03-15 MED ORDER — CARBAMAZEPINE 200 MG PO TABS: 200.0000 mg | ORAL_TABLET | Freq: Two times a day (BID) | ORAL | 3 refills | Status: DC

## 2019-03-15 NOTE — Progress Notes (Signed)
NEUROLOGY FOLLOW UP OFFICE NOTE  Jerome Hensley [id number] Mar 22, 1976  HISTORY OF PRESENT ILLNESS: I had the pleasure of seeing Jerome Hensley in follow-up in the neurology clinic on 03/15/2019.  The patient was last seen a year ago for seizures. He is alone in the office today. He has not yet done the EEG. On his last visit he was restarted on carbamazepine 200mg  BID due to concern about nocturnal seizures where he would shake a little like he is stretching his arms and arms are stiff, mostly occurring between 3-5AM. Since restarting CBZ, his wife has not reported any further nocturnal episodes. He has occasional body jerks. He denies any staring/unresponsive episodes, gaps in time, olfactory/gustatory hallucinations, focal numbness/tingling/weakness. He denies any dizziness or diplopia. He reports a lot of tension headaches due to his job, as well as due to allergies. He occasionally takes Ibuprofen around once a week with good effect. He denies any falls. His BP today is elevated 195/133, asymptomatic, he last took his BP medication an hour ago. He states BP usually runs 140-160/90-100.    History on Initial Assessment 09/08/2017: This is a very pleasant 43 year old right-handed man with a history of hypertension, asthma, and seizures, presenting to establish care. He reports a history of seizures between the ages of 1 to 89 where he was told he would having staring spells and generalized convulsions. He was taking a medication called Slobid(?) which helped with the seizures, this was discontinued, he reports that seizures stopped with prayers and medication was stopped. He did well with no seizures until his senior year in college at age 43 or [age] when he was sleep deprived he would have episodes of shaking of the upper extremities where his arms would jerk and he could not move them for 15 seconds to 2 minutes. He states he would be conscious during them, legs are not affected, he would not fall  if they occurred standing. He had them on and off when he would work night shifts, occurring in the early morning hours, sometimes waking him up from sleep. He did not take any medication until he started seeing neurologist Dr. Adelene IdlerFerraru in the mid-2000s and was started on carbamazepine 200mg  BID. He was not having the seizures while on carbamazepine, no side effects, but stopped the medication 4-5 years ago after he stopped seeing Dr. Adelene IdlerFerraru. He has not had any of the prior symptoms he was having when younger, but now his wife feels he is having nocturnal seizures. He wakes up and shakes a little like he is stretching his arms. He states they are not like the seizures in college, "it's more like you yawn and stretch, then arms are stiff." He goes back to sleep but states he remembers everything. They mostly occur in the hours between 3-5AM. His wife has told him she feels him stiffening, but they do not speak to each other. He has not bitten his tongue or had urinary incontinence. These do not happen in the daytime. He denies any staring/unresponsive episodes, gaps in time, focal numbness/tingling/weakness. He recalls olfactory and epigastric/nausea symptoms prior to his seizures in college where "air would smell different," this has not happened recently.   He has a history of right-sided Bell's palsy in 2004 with mild residual facial weakness and synkinesis. He has low back pain. He has dry mouth (which he had with carbamazepine in the past, now attributes to BP medication). He denies any headaches, dizziness, diplopia, dysarthria/dysphagia, neck pain, bowel/bladder dysfunction.  He works in Consulting civil engineer and drives. He was told he had a normal MRI and EEG, as well as sleep study in the mid-2000s with Dr. Adelene Idler, records requested for review.   Epilepsy Risk Factors:  He had a normal birth and early development.  There is no history of febrile convulsions, CNS infections such as meningitis/encephalitis, significant  traumatic brain injury, neurosurgical procedures, or family history of seizures  PAST MEDICAL HISTORY: Past Medical History:  Diagnosis Date  . Allergy   . Asthma, chronic 07/14/2014  . Hypertension   . Seizures (HCC)     MEDICATIONS: Current Outpatient Medications on File Prior to Visit  Medication Sig Dispense Refill  . aspirin 81 MG tablet Take 81 mg by mouth daily.    . carbamazepine (TEGRETOL) 200 MG tablet TAKE 1 TABLET TWICE A DAY 180 tablet 3  . Cholecalciferol (VITAMIN D PO) Take 8,000 Units by mouth daily.    Marland Kitchen eplerenone (INSPRA) 25 MG tablet     . hydrALAZINE (APRESOLINE) 100 MG tablet Take 100 mg by mouth 3 (three) times daily.    . hydrochlorothiazide (HYDRODIURIL) 25 MG tablet Take 25 mg by mouth daily.    Marland Kitchen losartan (COZAAR) 100 MG tablet Take 100 mg by mouth daily.    . metoprolol succinate (TOPROL-XL) 100 MG 24 hr tablet Take 100 mg by mouth daily.    . montelukast (SINGULAIR) 10 MG tablet Take 10 mg by mouth at bedtime.    . Multiple Vitamin (MULTIVITAMIN) capsule Take 1 capsule by mouth daily.    . Omega-3 Fatty Acids (FISH OIL) 1000 MG CAPS Take by mouth daily.    . TURMERIC PO Take by mouth.     No current facility-administered medications on file prior to visit.     ALLERGIES: No Known Allergies  FAMILY HISTORY: Family History  Problem Relation Age of Onset  . Heart disease Mother   . Heart disease Maternal Uncle   . Arthritis Maternal Grandmother   . Prostate cancer Maternal Uncle   . Diabetes Maternal Uncle     SOCIAL HISTORY: Social History   Socioeconomic History  . Marital status: Married    Spouse name: Not on file  . Number of children: Not on file  . Years of education: Not on file  . Highest education level: Not on file  Occupational History  . Not on file  Social Needs  . Financial resource strain: Not on file  . Food insecurity    Worry: Not on file    Inability: Not on file  . Transportation needs    Medical: Not on file     Non-medical: Not on file  Tobacco Use  . Smoking status: Never Smoker  . Smokeless tobacco: Never Used  Substance and Sexual Activity  . Alcohol use: No  . Drug use: No  . Sexual activity: Yes    Partners: Female  Lifestyle  . Physical activity    Days per week: Not on file    Minutes per session: Not on file  . Stress: Not on file  Relationships  . Social Musician on phone: Not on file    Gets together: Not on file    Attends religious service: Not on file    Active member of club or organization: Not on file    Attends meetings of clubs or organizations: Not on file    Relationship status: Not on file  . Intimate partner violence    Fear of  current or ex partner: Not on file    Emotionally abused: Not on file    Physically abused: Not on file    Forced sexual activity: Not on file  Other Topics Concern  . 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      Home Situation: lives with wife and two children       Spiritual Beliefs:  Christian      Lifestyle: no regular exercise; healthy  diet       REVIEW OF SYSTEMS: Constitutional: No fevers, chills, or sweats, no generalized fatigue, change in appetite Eyes: No visual changes, double vision, eye pain Ear, nose and throat: No hearing loss, ear pain, nasal congestion, sore throat Cardiovascular: No chest pain, palpitations Respiratory:  No shortness of breath at rest or with exertion, wheezes GastrointestinaI: No nausea, vomiting, diarrhea, abdominal pain, fecal incontinence Genitourinary:  No dysuria, urinary retention or frequency Musculoskeletal:  No neck pain, back pain Integumentary: No rash, pruritus, skin lesions Neurological: as above Psychiatric: No depression, insomnia, anxiety Endocrine: No palpitations, fatigue, diaphoresis, mood swings, change in appetite, change in weight, increased thirst Hematologic/Lymphatic:  No  anemia, purpura, petechiae. Allergic/Immunologic: no itchy/runny eyes, nasal congestion, recent allergic reactions, rashes  PHYSICAL EXAM: Vitals:   03/15/19 0843  BP: (!) 195/133  Pulse: 85  SpO2: 100%   General: No acute distress Head:  Normocephalic/atraumatic Skin/Extremities: No rash, no edema Neurological Exam: alert and oriented to person, place, and time. No aphasia or dysarthria. Fund of knowledge is appropriate.  Recent and remote memory are intact.  Attention and concentration are normal.    Able to name objects and repeat phrases. Cranial nerves: Pupils equal, round, reactive to light. Extraocular movements intact with no nystagmus. Visual fields full. Facial sensation intact. No facial asymmetry. Tongue, uvula, palate midline.  Motor: Bulk and tone normal, muscle strength 5/5 throughout with no pronator drift.  Deep tendon reflexes 2+ throughout, toes downgoing.  Finger to nose testing intact.  Gait narrow-based and steady, able to tandem walk adequately.  Romberg negative.  IMPRESSION: This is a very pleasant 43 yo RH man with a history of hypertension, asthma, seizures since childhood, suggestive of generalized epilepsy. He felt better while taking carbamazepine 200mg  BID but stopped after he was lost to follow-up with his prior neurologist. His wife expressed concern about nocturnal seizures which have quieted down with resuming carbamazepine 200mg  BID. CBZ is indicated for focal seizures, however since he has had a good response, we agreed to continue current medication for now. If he starts having any change in symptoms, we will proceed with EEG. He is aware of [place] driving laws to stop driving after a seizure, until 6 months seizure-free. Follow-up in 1 year, he knows to call for any changes.  Thank you for allowing me to participate in his care.  Please do not hesitate to call for any questions or concerns.   Ellouise Newer, M.D.   CC: Dr. Maudie Mercury

## 2019-03-15 NOTE — Patient Instructions (Signed)
Great seeing you! Continue carbamazepine 200mg  twice a day. For any change or recurrence in symptoms, we will proceed with EEG, call our office to schedule. Follow-up in 1 year.  Seizure Precautions: 1. If medication has been prescribed for you to prevent seizures, take it exactly as directed.  Do not stop taking the medicine without talking to your doctor first, even if you have not had a seizure in a long time.   2. Avoid activities in which a seizure would cause danger to yourself or to others.  Don't operate dangerous machinery, swim alone, or climb in high or dangerous places, such as on ladders, roofs, or girders.  Do not drive unless your doctor says you may.  3. If you have any warning that you may have a seizure, lay down in a safe place where you can't hurt yourself.    4.  No driving for 6 months from last seizure, as per [facility].   Please refer to the following link on the [facility] website for more information: http://[email or web address]/[email or web address]/drivingu.cfm   5.  Maintain good sleep hygiene. Avoid alcohol.  6.  Contact your doctor if you have any problems that may be related to the medicine you are taking.  7.  Call 911 and bring the patient back to the ED if:        A.  The seizure lasts longer than 5 minutes.       B.  The patient doesn't awaken shortly after the seizure  C.  The patient has new problems such as difficulty seeing, speaking or moving  D.  The patient was injured during the seizure  E.  The patient has a temperature over 102 F (39C)  F.  The patient vomited and now is having trouble breathing

## 2019-03-27 NOTE — Progress Notes (Signed)
Call and left a message to call us  Back for a appt

## 2019-04-04 ENCOUNTER — Encounter (HOSPITAL_COMMUNITY): Payer: Self-pay | Admitting: Emergency Medicine

## 2019-04-04 ENCOUNTER — Ambulatory Visit (HOSPITAL_COMMUNITY)
Admission: EM | Admit: 2019-04-04 | Discharge: 2019-04-04 | Disposition: A | Discharge status: Home / Self Care | Payer: BC Managed Care – PPO | Attending: Family Medicine | Admitting: Family Medicine

## 2019-04-04 ENCOUNTER — Other Ambulatory Visit: Payer: Self-pay

## 2019-04-04 DIAGNOSIS — I10 Essential (primary) hypertension: Secondary | ICD-10-CM

## 2019-04-04 LAB — BASIC METABOLIC PANEL
Anion gap: 9 (ref 5–15)
BUN: 9 mg/dL (ref 6–20)
CO2: 29 mmol/L (ref 22–32)
Calcium: 10 mg/dL (ref 8.9–10.3)
Chloride: 101 mmol/L (ref 98–111)
Creatinine, Ser: 1.28 mg/dL — ABNORMAL HIGH (ref 0.61–1.24)
GFR calc Af Amer: >60 mL/min (ref >60)
GFR calc non Af Amer: >60 mL/min (ref >60)
Glucose, Bld: 109 mg/dL — ABNORMAL HIGH (ref 70–99)
Potassium: 3.5 mmol/L (ref 3.5–5.1)
Sodium: 139 mmol/L (ref 135–145)

## 2019-04-04 MED ORDER — EPLERENONE 25 MG PO TABS: 25.0000 mg | ORAL_TABLET | Freq: Every day | ORAL | 1 refills | Status: DC

## 2019-04-04 MED ORDER — METOPROLOL SUCCINATE ER 100 MG PO TB24: 100.0000 mg | ORAL_TABLET | Freq: Every day | ORAL | 1 refills | Status: DC

## 2019-04-04 NOTE — ED Triage Notes (Signed)
PT has been hypertensive for weeks. Has been out of meds for 2 weeks. PT saw neuro 10/9 and was told to follow up with cardiology. Left a message for cardiology today.   Has had headaches

## 2019-04-04 NOTE — ED Triage Notes (Signed)
Needs refill inspra and metoprolol

## 2019-04-05 ENCOUNTER — Telehealth (HOSPITAL_COMMUNITY): Payer: Self-pay | Admitting: Emergency Medicine

## 2019-04-05 NOTE — Telephone Encounter (Signed)
Reviewed with Dr. Meda Coffee. No significant abnormalities. Pt needs follow up with PCP. Attempted to contact patient to inform him, no answer.

## 2019-04-09 NOTE — ED Provider Notes (Signed)
Jerome Hensley   [id number] 04/04/19 Arrival Time: 1503  ASSESSMENT & PLAN:  1. Uncontrolled hypertension     Refilled that she may begin: Meds ordered this encounter  Medications  . metoprolol succinate (TOPROL-XL) 100 MG 24 hr tablet    Sig: Take 1 tablet (100 mg total) by mouth daily.    Dispense:  30 tablet    Refill:  1  . eplerenone (INSPRA) 25 MG tablet    Sig: Take 1 tablet (25 mg total) by mouth daily.    Dispense:  30 tablet    Refill:  1   She is looking to est care with a PCP. She is trying to get a f/u appt with a cardiologist. Asymptomatic at this time. May return here to recheck BP anytime she wishes.   Reviewed expectations re: course of current medical issues. Questions answered. Outlined signs and symptoms indicating need for more acute intervention. Patient verbalized understanding. After Visit Summary given.   SUBJECTIVE:  Jerome Hensley is a 43 y.o. male who presents with concerns regarding increased blood pressures. She  reports that he has been treated for hypertension in the past.  He reports no TIA's, no chest pain on exertion, no dyspnea on exertion, no swelling of ankles, no orthostatic dizziness or lightheadedness, no orthopnea or paroxysmal nocturnal dyspnea, no palpitations and no intermittent claudication symptoms  No visual changes. Occasional mild headaches but nothing new.  Requests refills of HTN medications; has been out for two weeks.   Social History   Tobacco Use  Smoking Status Never Smoker  Smokeless Tobacco Never Used    ROS: As per HPI. All other systems negative.   OBJECTIVE:  Vitals:   04/04/19 1529  BP: (!) 175/117  Pulse: (!) 111  Resp: 16  Temp: 97.9 F (36.6 C)  TempSrc: Temporal  SpO2: 99%    BP noted.  General appearance: alert; no distress Eyes: PERRLA; EOMI HENT: normocephalic; atraumatic Neck: supple Lungs: clear to auscultation bilaterally Heart: tachycardic (recheck pulse 102);  regular Abdomen: soft, non-tender; bowel sounds normal Extremities: no edema; symmetrical with no gross deformities Skin: warm and dry Psychological: alert and cooperative; normal mood and affect  Labs: Results for orders placed or performed during the hospital encounter of 04/04/19  Basic metabolic panel  Result Value Ref Range   Sodium 139 135 - 145 mmol/L   Potassium 3.5 3.5 - 5.1 mmol/L   Chloride 101 98 - 111 mmol/L   CO2 29 22 - 32 mmol/L   Glucose, Bld 109 (H) 70 - 99 mg/dL   BUN 9 6 - 20 mg/dL   Creatinine, Ser 6.54 (H) 0.61 - 1.24 mg/dL   Calcium 65.0 8.9 - 35.4 mg/dL   GFR calc non Af Amer >60 >60 mL/min   GFR calc Af Amer >60 >60 mL/min   Anion gap 9 5 - 15    No Known Allergies  Past Medical History:  Diagnosis Date  . Allergy   . Asthma, chronic 07/14/2014  . Hypertension   . Seizures (HCC)    Social History   Socioeconomic History  . Marital status: Married    Spouse name: Not on file  . Number of children: Not on file  . Years of education: Not on file  . Highest education level: Not on file  Occupational History  . Not on file  Social Needs  . Financial resource strain: Not on file  . Food insecurity    Worry: Not on file  Inability: Not on file  . Transportation needs    Medical: Not on file    Non-medical: Not on file  Tobacco Use  . Smoking status: Never Smoker  . Smokeless tobacco: Never Used  Substance and Sexual Activity  . Alcohol use: No  . Drug use: No  . Sexual activity: Yes    Partners: Female  Lifestyle  . Physical activity    Days per week: Not on file    Minutes per session: Not on file  . Stress: Not on file  Relationships  . Social Herbalist on phone: Not on file    Gets together: Not on file    Attends religious service: Not on file    Active member of club or organization: Not on file    Attends meetings of clubs or organizations: Not on file    Relationship status: Not on file  . Intimate partner  violence    Fear of current or ex partner: Not on file    Emotionally abused: Not on file    Physically abused: Not on file    Forced sexual activity: Not on file  Other Topics Concern  . 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 Heart disease Mother   . Heart disease Maternal Uncle   . Arthritis Maternal Grandmother   . Prostate cancer Maternal Uncle   . Diabetes Maternal Uncle    Past Surgical History:  Procedure Laterality Date  . Mole excision    . WRIST ARTHROSCOPY Left 2003      Vanessa Kick, MD 04/09/19 [id number]

## 2019-04-16 DIAGNOSIS — Z20828 Contact with and (suspected) exposure to other viral communicable diseases: Secondary | ICD-10-CM | POA: Diagnosis not present

## 2019-05-10 DIAGNOSIS — Z20828 Contact with and (suspected) exposure to other viral communicable diseases: Secondary | ICD-10-CM | POA: Diagnosis not present

## 2019-06-19 ENCOUNTER — Other Ambulatory Visit: Payer: Self-pay

## 2019-06-20 ENCOUNTER — Encounter: Payer: Self-pay | Admitting: Family Medicine

## 2019-06-20 ENCOUNTER — Ambulatory Visit (INDEPENDENT_AMBULATORY_CARE_PROVIDER_SITE_OTHER): Payer: BC Managed Care – PPO | Admitting: Family Medicine

## 2019-06-20 VITALS — BP 158/90 | HR 88 | Temp 97.9°F | Wt 252.0 lb

## 2019-06-20 DIAGNOSIS — I160 Hypertensive urgency: Secondary | ICD-10-CM | POA: Diagnosis not present

## 2019-06-20 DIAGNOSIS — Z13220 Encounter for screening for lipoid disorders: Secondary | ICD-10-CM | POA: Diagnosis not present

## 2019-06-20 DIAGNOSIS — Z125 Encounter for screening for malignant neoplasm of prostate: Secondary | ICD-10-CM | POA: Diagnosis not present

## 2019-06-20 DIAGNOSIS — Z0000 Encounter for general adult medical examination without abnormal findings: Secondary | ICD-10-CM

## 2019-06-20 DIAGNOSIS — E6609 Other obesity due to excess calories: Secondary | ICD-10-CM | POA: Diagnosis not present

## 2019-06-20 LAB — CBC WITH DIFFERENTIAL/PLATELET
Basophils Absolute: 0 10*3/uL (ref 0.0–0.1)
Basophils Relative: 0.6 % (ref 0.0–3.0)
Eosinophils Absolute: 0 10*3/uL (ref 0.0–0.7)
Eosinophils Relative: 0.8 % (ref 0.0–5.0)
HCT: 42.5 % (ref 39.0–52.0)
Hemoglobin: 14.6 g/dL (ref 13.0–17.0)
Lymphocytes Relative: 59.7 % — ABNORMAL HIGH (ref 12.0–46.0)
Lymphs Abs: 3.1 10*3/uL (ref 0.7–4.0)
MCHC: 34.2 g/dL (ref 30.0–36.0)
MCV: 93.5 fl (ref 78.0–100.0)
Monocytes Absolute: 0.5 10*3/uL (ref 0.1–1.0)
Monocytes Relative: 9.1 % (ref 3.0–12.0)
Neutro Abs: 1.5 10*3/uL (ref 1.4–7.7)
Neutrophils Relative %: 29.8 % — ABNORMAL LOW (ref 43.0–77.0)
Platelets: 360 10*3/uL (ref 150.0–400.0)
RBC: 4.55 Mil/uL (ref 4.22–5.81)
RDW: 14.4 % (ref 11.5–15.5)
WBC: 5.1 10*3/uL (ref 4.0–10.5)

## 2019-06-20 LAB — BASIC METABOLIC PANEL
BUN: 12 mg/dL (ref 6–23)
CO2: 29 mEq/L (ref 19–32)
Calcium: 9.6 mg/dL (ref 8.4–10.5)
Chloride: 102 mEq/L (ref 96–112)
Creatinine, Ser: 1.05 mg/dL (ref 0.40–1.50)
GFR: 92.15 mL/min (ref >60.00)
Glucose, Bld: 90 mg/dL (ref 70–99)
Potassium: 4.2 mEq/L (ref 3.5–5.1)
Sodium: 139 mEq/L (ref 135–145)

## 2019-06-20 LAB — LIPID PANEL
Cholesterol: 172 mg/dL (ref 0–200)
HDL: 59.7 mg/dL (ref >39.00)
LDL Cholesterol: 92 mg/dL (ref 0–99)
NonHDL: 112.47
Total CHOL/HDL Ratio: 3
Triglycerides: 100 mg/dL (ref 0.0–149.0)
VLDL: 20 mg/dL (ref 0.0–40.0)

## 2019-06-20 LAB — HEMOGLOBIN A1C: Hgb A1c MFr Bld: 5.3 % (ref 4.6–6.5)

## 2019-06-20 LAB — PSA: PSA: 1.03 ng/mL (ref 0.10–4.00)

## 2019-06-20 NOTE — Patient Instructions (Signed)
Preventive Care 41-46 Years Old, Male Preventive care refers to lifestyle choices and visits with your health care provider that can promote health and wellness. This includes:  A yearly physical exam. This is also called an annual well check.  Regular dental and eye exams.  Immunizations.  Screening for certain conditions.  Healthy lifestyle choices, such as eating a healthy diet, getting regular exercise, not using drugs or products that contain nicotine and tobacco, and limiting alcohol use. What can I expect for my preventive care visit? Physical exam Your health care provider will check:  Height and weight. These may be used to calculate body mass index (BMI), which is a measurement that tells if you are at a healthy weight.  Heart rate and blood pressure.  Your skin for abnormal spots. Counseling Your health care provider may ask you questions about:  Alcohol, tobacco, and drug use.  Emotional well-being.  Home and relationship well-being.  Sexual activity.  Eating habits.  Work and work Statistician. What immunizations do I need?  Influenza (flu) vaccine  This is recommended every year. Tetanus, diphtheria, and pertussis (Tdap) vaccine  You may need a Td booster every 10 years. Varicella (chickenpox) vaccine  You may need this vaccine if you have not already been vaccinated. Zoster (shingles) vaccine  You may need this after age 64. Measles, mumps, and rubella (MMR) vaccine  You may need at least one dose of MMR if you were born in 1957 or later. You may also need a second dose. Pneumococcal conjugate (PCV13) vaccine  You may need this if you have certain conditions and were not previously vaccinated. Pneumococcal polysaccharide (PPSV23) vaccine  You may need one or two doses if you smoke cigarettes or if you have certain conditions. Meningococcal conjugate (MenACWY) vaccine  You may need this if you have certain conditions. Hepatitis A  vaccine  You may need this if you have certain conditions or if you travel or work in places where you may be exposed to hepatitis A. Hepatitis B vaccine  You may need this if you have certain conditions or if you travel or work in places where you may be exposed to hepatitis B. Haemophilus influenzae type b (Hib) vaccine  You may need this if you have certain risk factors. Human papillomavirus (HPV) vaccine  If recommended by your health care provider, you may need three doses over 6 months. You may receive vaccines as individual doses or as more than one vaccine together in one shot (combination vaccines). Talk with your health care provider about the risks and benefits of combination vaccines. What tests do I need? Blood tests  Lipid and cholesterol levels. These may be checked every 5 years, or more frequently if you are over 60 years old.  Hepatitis C test.  Hepatitis B test. Screening  Lung cancer screening. You may have this screening every year starting at age 43 if you have a 30-pack-year history of smoking and currently smoke or have quit within the past 15 years.  Prostate cancer screening. Recommendations will vary depending on your family history and other risks.  Colorectal cancer screening. All adults should have this screening starting at age 72 and continuing until age 2. Your health care provider may recommend screening at age 14 if you are at increased risk. You will have tests every 1-10 years, depending on your results and the type of screening test.  Diabetes screening. This is done by checking your blood sugar (glucose) after you have not eaten  for a while (fasting). You may have this done every 1-3 years.  Sexually transmitted disease (STD) testing. Follow these instructions at home: Eating and drinking  Eat a diet that includes fresh fruits and vegetables, whole grains, lean protein, and low-fat dairy products.  Take vitamin and mineral supplements as  recommended by your health care provider.  Do not drink alcohol if your health care provider tells you not to drink.  If you drink alcohol: ? Limit how much you have to 0-2 drinks a day. ? Be aware of how much alcohol is in your drink. In the [place] one drink equals one 12 oz bottle of beer (355 mL), one 5 oz glass of wine (148 mL), or one 1 oz glass of hard liquor (44 mL). Lifestyle  Take daily care of your teeth and gums.  Stay active. Exercise for at least 30 minutes on 5 or more days each week.  Do not use any products that contain nicotine or tobacco, such as cigarettes, e-cigarettes, and chewing tobacco. If you need help quitting, ask your health care provider.  If you are sexually active, practice safe sex. Use a condom or other form of protection to prevent STIs (sexually transmitted infections).  Talk with your health care provider about taking a low-dose aspirin every day starting at age 61. What's next?  Go to your health care provider once a year for a well check visit.  Ask your health care provider how often you should have your eyes and teeth checked.  Stay up to date on all vaccines. This information is not intended to replace advice given to you by your health care provider. Make sure you discuss any questions you have with your health care provider. Document Revised: 05/17/2018 Document Reviewed: 05/17/2018 Elsevier Patient Education  2020 [employer].  Managing Your Hypertension Hypertension is commonly called high blood pressure. This is when the force of your blood pressing against the walls of your arteries is too strong. Arteries are blood vessels that carry blood from your heart throughout your body. Hypertension forces the heart to work harder to pump blood, and may cause the arteries to become narrow or stiff. Having untreated or uncontrolled hypertension can cause heart attack, stroke, kidney disease, and other problems. What are blood pressure readings? A  blood pressure reading consists of a higher number over a lower number. Ideally, your blood pressure should be below 120/80. The first ("top") number is called the systolic pressure. It is a measure of the pressure in your arteries as your heart beats. The second ("bottom") number is called the diastolic pressure. It is a measure of the pressure in your arteries as the heart relaxes. What does my blood pressure reading mean? Blood pressure is classified into four stages. Based on your blood pressure reading, your health care provider may use the following stages to determine what type of treatment you need, if any. Systolic pressure and diastolic pressure are measured in a unit called mm Hg. Normal  Systolic pressure: below 762.  Diastolic pressure: below 80. Elevated  Systolic pressure: 831-517.  Diastolic pressure: below 80. Hypertension stage 1  Systolic pressure: 616-073.  Diastolic pressure: 71-06. Hypertension stage 2  Systolic pressure: 269 or above.  Diastolic pressure: 90 or above. What health risks are associated with hypertension? Managing your hypertension is an important responsibility. Uncontrolled hypertension can lead to:  A heart attack.  A stroke.  A weakened blood vessel (aneurysm).  Heart failure.  Kidney damage.  Eye damage.  Metabolic syndrome.  Memory and concentration problems. What changes can I make to manage my hypertension? Hypertension can be managed by making lifestyle changes and possibly by taking medicines. Your health care provider will help you make a plan to bring your blood pressure within a normal range. Eating and drinking   Eat a diet that is high in fiber and potassium, and low in salt (sodium), added sugar, and fat. An example eating plan is called the DASH (Dietary Approaches to Stop Hypertension) diet. To eat this way: ? Eat plenty of fresh fruits and vegetables. Try to fill half of your plate at each meal with fruits and  vegetables. ? Eat whole grains, such as whole wheat pasta, brown rice, or whole grain bread. Fill about one quarter of your plate with whole grains. ? Eat low-fat diary products. ? Avoid fatty cuts of meat, processed or cured meats, and poultry with skin. Fill about one quarter of your plate with lean proteins such as fish, chicken without skin, beans, eggs, and tofu. ? Avoid premade and processed foods. These tend to be higher in sodium, added sugar, and fat.  Reduce your daily sodium intake. Most people with hypertension should eat less than 1,500 mg of sodium a day.  Limit alcohol intake to no more than 1 drink a day for nonpregnant women and 2 drinks a day for men. One drink equals 12 oz of beer, 5 oz of wine, or 1 oz of hard liquor. Lifestyle  Work with your health care provider to maintain a healthy body weight, or to lose weight. Ask what an ideal weight is for you.  Get at least 30 minutes of exercise that causes your heart to beat faster (aerobic exercise) most days of the week. Activities may include walking, swimming, or biking.  Include exercise to strengthen your muscles (resistance exercise), such as weight lifting, as part of your weekly exercise routine. Try to do these types of exercises for 30 minutes at least 3 days a week.  Do not use any products that contain nicotine or tobacco, such as cigarettes and e-cigarettes. If you need help quitting, ask your health care provider.  Control any long-term (chronic) conditions you have, such as high cholesterol or diabetes. Monitoring  Monitor your blood pressure at home as told by your health care provider. Your personal target blood pressure may vary depending on your medical conditions, your age, and other factors.  Have your blood pressure checked regularly, as often as told by your health care provider. Working with your health care provider  Review all the medicines you take with your health care provider because there may  be side effects or interactions.  Talk with your health care provider about your diet, exercise habits, and other lifestyle factors that may be contributing to hypertension.  Visit your health care provider regularly. Your health care provider can help you create and adjust your plan for managing hypertension. Will I need medicine to control my blood pressure? Your health care provider may prescribe medicine if lifestyle changes are not enough to get your blood pressure under control, and if:  Your systolic blood pressure is 130 or higher.  Your diastolic blood pressure is 80 or higher. Take medicines only as told by your health care provider. Follow the directions carefully. Blood pressure medicines must be taken as prescribed. The medicine does not work as well when you skip doses. Skipping doses also puts you at risk for problems. Contact a health care provider if:  You think you are having a reaction to medicines you have taken.  You have repeated (recurrent) headaches.  You feel dizzy.  You have swelling in your ankles.  You have trouble with your vision. Get help right away if:  You develop a severe headache or confusion.  You have unusual weakness or numbness, or you feel faint.  You have severe pain in your chest or abdomen.  You vomit repeatedly.  You have trouble breathing. Summary  Hypertension is when the force of blood pumping through your arteries is too strong. If this condition is not controlled, it may put you at risk for serious complications.  Your personal target blood pressure may vary depending on your medical conditions, your age, and other factors. For most people, a normal blood pressure is less than 120/80.  Hypertension is managed by lifestyle changes, medicines, or both. Lifestyle changes include weight loss, eating a healthy, low-sodium diet, exercising more, and limiting alcohol. This information is not intended to replace advice given to you by  your health care provider. Make sure you discuss any questions you have with your health care provider. Document Revised: 09/14/2018 Document Reviewed: 04/20/2016 Elsevier Patient Education  [place] DASH stands for "Dietary Approaches to Stop Hypertension." The DASH eating plan is a healthy eating plan that has been shown to reduce high blood pressure (hypertension). It may also reduce your risk for type 2 diabetes, heart disease, and stroke. The DASH eating plan may also help with weight loss. What are tips for following this plan?  General guidelines  Avoid eating more than 2,300 mg (milligrams) of salt (sodium) a day. If you have hypertension, you may need to reduce your sodium intake to 1,500 mg a day.  Limit alcohol intake to no more than 1 drink a day for nonpregnant women and 2 drinks a day for men. One drink equals 12 oz of beer, 5 oz of wine, or 1 oz of hard liquor.  Work with your health care provider to maintain a healthy body weight or to lose weight. Ask what an ideal weight is for you.  Get at least 30 minutes of exercise that causes your heart to beat faster (aerobic exercise) most days of the week. Activities may include walking, swimming, or biking.  Work with your health care provider or diet and nutrition specialist (dietitian) to adjust your eating plan to your individual calorie needs. Reading food labels   Check food labels for the amount of sodium per serving. Choose foods with less than 5 percent of the Daily Value of sodium. Generally, foods with less than 300 mg of sodium per serving fit into this eating plan.  To find whole grains, look for the word "whole" as the first word in the ingredient list. Shopping  Buy products labeled as "low-sodium" or "no salt added."  Buy fresh foods. Avoid canned foods and premade or frozen meals. Cooking  Avoid adding salt when cooking. Use salt-free seasonings or herbs instead of table salt or  sea salt. Check with your health care provider or pharmacist before using salt substitutes.  Do not fry foods. Cook foods using healthy methods such as baking, boiling, grilling, and broiling instead.  Cook with heart-healthy oils, such as olive, canola, soybean, or sunflower oil. Meal planning  Eat a balanced diet that includes: ? 5 or more servings of fruits and vegetables each day. At each meal, try to fill half of your plate with fruits and vegetables. ?  Up to 6-8 servings of whole grains each day. ? Less than 6 oz of lean meat, poultry, or fish each day. A 3-oz serving of meat is about the same size as a deck of cards. One egg equals 1 oz. ? 2 servings of low-fat dairy each day. ? A serving of nuts, seeds, or beans 5 times each week. ? Heart-healthy fats. Healthy fats called Omega-3 fatty acids are found in foods such as flaxseeds and coldwater fish, like sardines, salmon, and mackerel.  Limit how much you eat of the following: ? Canned or prepackaged foods. ? Food that is high in trans fat, such as fried foods. ? Food that is high in saturated fat, such as fatty meat. ? Sweets, desserts, sugary drinks, and other foods with added sugar. ? Full-fat dairy products.  Do not salt foods before eating.  Try to eat at least 2 vegetarian meals each week.  Eat more home-cooked food and less restaurant, buffet, and fast food.  When eating at a restaurant, ask that your food be prepared with less salt or no salt, if possible. What foods are recommended? The items listed may not be a complete list. Talk with your dietitian about what dietary choices are best for you. Grains Whole-grain or whole-wheat bread. Whole-grain or whole-wheat pasta. Brown rice. [place]. Bulgur. Whole-grain and low-sodium cereals. Pita bread. Low-fat, low-sodium crackers. Whole-wheat flour tortillas. Vegetables Fresh or frozen vegetables (raw, steamed, roasted, or grilled). Low-sodium or reduced-sodium  tomato and vegetable juice. Low-sodium or reduced-sodium tomato sauce and tomato paste. Low-sodium or reduced-sodium canned vegetables. Fruits All fresh, dried, or frozen fruit. Canned fruit in natural juice (without added sugar). Meat and other protein foods Skinless chicken or [place]. Ground chicken or [place]. Pork with fat trimmed off. Fish and seafood. Egg whites. Dried beans, peas, or lentils. Unsalted nuts, nut butters, and seeds. Unsalted canned beans. Lean cuts of beef with fat trimmed off. Low-sodium, lean deli meat. Dairy Low-fat (1%) or fat-free (skim) milk. Fat-free, low-fat, or reduced-fat cheeses. Nonfat, low-sodium ricotta or cottage cheese. Low-fat or nonfat yogurt. Low-fat, low-sodium cheese. Fats and oils Soft margarine without trans fats. Vegetable oil. Low-fat, reduced-fat, or light mayonnaise and salad dressings (reduced-sodium). Canola, safflower, olive, soybean, and sunflower oils. Avocado. Seasoning and other foods Herbs. Spices. Seasoning mixes without salt. Unsalted popcorn and pretzels. Fat-free sweets. What foods are not recommended? The items listed may not be a complete list. Talk with your dietitian about what dietary choices are best for you. Grains Baked goods made with fat, such as croissants, muffins, or some breads. Dry pasta or rice meal packs. Vegetables Creamed or fried vegetables. Vegetables in a cheese sauce. Regular canned vegetables (not low-sodium or reduced-sodium). Regular canned tomato sauce and paste (not low-sodium or reduced-sodium). Regular tomato and vegetable juice (not low-sodium or reduced-sodium). Angie Fava. Olives. Fruits Canned fruit in a light or heavy syrup. Fried fruit. Fruit in cream or butter sauce. Meat and other protein foods Fatty cuts of meat. Ribs. Fried meat. Berniece Salines. Sausage. Bologna and other processed lunch meats. Salami. Fatback. Hotdogs. Bratwurst. Salted nuts and seeds. Canned beans with added salt. Canned or smoked fish.  Whole eggs or egg yolks. Chicken or [place] with skin. Dairy Whole or 2% milk, cream, and half-and-half. Whole or full-fat cream cheese. Whole-fat or sweetened yogurt. Full-fat cheese. Nondairy creamers. Whipped toppings. Processed cheese and cheese spreads. Fats and oils Butter. Stick margarine. Lard. Shortening. Ghee. Bacon fat. Tropical oils, such as coconut, palm kernel, or palm oil.  Seasoning and other foods Salted popcorn and pretzels. Onion salt, garlic salt, seasoned salt, table salt, and sea salt. Worcestershire sauce. Tartar sauce. Barbecue sauce. Teriyaki sauce. Soy sauce, including reduced-sodium. Steak sauce. Canned and packaged gravies. Fish sauce. Oyster sauce. Cocktail sauce. Horseradish that you find on the shelf. Ketchup. Mustard. Meat flavorings and tenderizers. Bouillon cubes. Hot sauce and Tabasco sauce. Premade or packaged marinades. Premade or packaged taco seasonings. Relishes. Regular salad dressings. Where to find more information:  National Heart, Lung, and [place]: https://wilson-eaton.com/  American Heart Association: www.heart.org Summary  The DASH eating plan is a healthy eating plan that has been shown to reduce high blood pressure (hypertension). It may also reduce your risk for type 2 diabetes, heart disease, and stroke.  With the DASH eating plan, you should limit salt (sodium) intake to 2,300 mg a day. If you have hypertension, you may need to reduce your sodium intake to 1,500 mg a day.  When on the DASH eating plan, aim to eat more fresh fruits and vegetables, whole grains, lean proteins, low-fat dairy, and heart-healthy fats.  Work with your health care provider or diet and nutrition specialist (dietitian) to adjust your eating plan to your individual calorie needs. This information is not intended to replace advice given to you by your health care provider. Make sure you discuss any questions you have with your health care provider. Document Revised:  05/05/2017 Document Reviewed: 05/16/2016 Elsevier Patient Education  2020 [employer].

## 2019-06-24 NOTE — Progress Notes (Addendum)
Subjective:     Jerome Hensley is a 44 y.o. male and is here for a comprehensive physical exam. The patient reports problems - HTN.  Pt states has been out of Toprol XL 100 mg x several days.  Pt denies headaches, chest pain, blurred vision.  BP at home typically 150-160/95-115.  Pt followed by nephrology, Dr. Bufford Buttner.  Last seen 4 months ago.  Next appointment 07/18/2019.  HCTZ changed to Lasix 40 mg.  Also taking eplerenone, hydralazine, losartan.  Pt eating fast food 2-8 x/wk. will drink 2 quarts to 1 gallon of water daily.  Not currently exercising.  Pt endorses history of asthma.  Currently stable, last asthma attack years ago.  Taking Singulair for allergies.  Pt also endorses history of epilepsy.  Last seizure several years ago.  Currently on Tegretol 200 mg twice daily.  Allergies: NKDA  Social history: Pt is working.  Pt is married.  Pt denies tobacco use.  Pt endorses social alcohol use.    Pt inquires about colonoscopy.  Social History   Socioeconomic History  . Marital status: Married    Spouse name: Not on file  . Number of children: Not on file  . Years of education: Not on file  . Highest education level: Not on file  Occupational History  . Not on file  Tobacco Use  . Smoking status: Never Smoker  . Smokeless tobacco: Never Used  Substance and Sexual Activity  . Alcohol use: No  . Drug use: No  . Sexual activity: Yes    Partners: Female  Other Topics Concern  . 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 Difficulty of Paying Living Expenses: Not on file  Food Insecurity:   . Worried About Programme researcher, broadcasting/film/video in the Last Year: Not on file  . Ran Out of Food in the  Last Year: Not on file  Transportation Needs:   . Lack of Transportation (Medical): Not on file  . Lack of Transportation (Non-Medical): Not on file  Physical Activity:   . Days of Exercise per Week: Not on file  . Minutes of Exercise per Session: Not on file  Stress:   . Feeling of Stress : Not on file  Social Connections:   . Frequency of Communication with Friends and Family: Not on file  . Frequency of Social Gatherings with Friends and Family: Not on file  . Attends Religious Services: Not on file  . Active Member of Clubs or Organizations: Not on file  . Attends Banker Meetings: Not on file  . Marital Status: Not on file  Intimate Partner Violence:   . Fear of Current or Ex-Partner: Not on file  . Emotionally Abused: Not on file  . Physically Abused: Not on file  . Sexually Abused: Not on file   Health Maintenance  Topic Date Due  . TETANUS/TDAP  08/03/2024  . INFLUENZA VACCINE  Completed  . HIV Screening  Completed    The following portions of the patient's history were reviewed and updated as appropriate: allergies, current medications, past family history, past medical  history, past social history, past surgical history and problem list.  Review of Systems Pertinent items noted in HPI and remainder of comprehensive ROS otherwise negative.   Objective:    BP (!) 168/118 (BP Location: Left Arm, Patient Position: Sitting, Cuff Size: Large)   Pulse 88   Temp 97.9 F (36.6 C) (Temporal)   Wt 252 lb (114.3 kg)   SpO2 96%   BMI 36.16 kg/m   Repeat BP 158/90 General appearance: alert, cooperative and no distress Head: Normocephalic, without obvious abnormality, atraumatic Eyes: conjunctivae/corneas clear. PERRL, EOM's intact. Fundi benign. Ears: normal TM's and external ear canals both ears Nose: Nares normal. Septum midline. Mucosa normal. No drainage or sinus tenderness. Throat: lips, mucosa, and tongue normal; teeth and gums normal Neck: no  adenopathy, no carotid bruit, no JVD, supple, symmetrical, trachea midline and thyroid not enlarged, symmetric, no tenderness/mass/nodules Lungs: clear to auscultation bilaterally Heart: regular rate and rhythm, S1, S2 normal, no murmur, click, rub or gallop Abdomen: soft, non-tender; bowel sounds normal; no masses,  no organomegaly Extremities: extremities normal, atraumatic, no cyanosis or edema Pulses: 2+ and symmetric Skin: Skin color, texture, turgor normal. No rashes or lesions Lymph nodes: Cervical, supraclavicular, and axillary nodes normal. Neurologic: Alert and oriented X 3, normal strength and tone. Normal symmetric reflexes. Normal coordination and gait    Assessment:    Pt is a 44 yo  male seen for CPE/TOC with HTN urgency.     Plan:     Anticipatory guidance given including wearing seatbelts, smoke detectors in the home, increasing physical activity, increasing p.o. intake of water and vegetables. -will obtain labs (CBC, BMP, lipid panel) -Discussed colon cancer screening.  Patient to inquire if covered at age 86 with insurance company.  If still will order. -Given handout -Next CPE in 1 year See After Visit Summary for Counseling Recommendations   Hypertensive urgency  -Uncontrolled, asymptomatic.  BP 168/118  Repeat 158/60 -Pt strongly encouraged to pick up medication from pharmacy -Discussed lifestyle modifications -Patient encouraged to continue checking BP at home and keep a log to bring with him to clinic. -Encouraged to bring BP cuff to next [facility] for comparison -Continue current medications eplerenone 25 mg nightly, hydralazine 100 mg 3 times daily, losartan 100 mg, Toprol-XL 100 mg, furosemide 40 mg -Continue follow-up with Nephrology -Given precautions.  For development of symptoms proceed to nearest ED - Plan: Basic Metabolic Panel  Screening for cholesterol level  - Plan: Lipid Panel  Obesity due to excess calories with serious comorbidity, unspecified  classification  - Plan: Hemoglobin A1c  Prostate cancer screening  - Plan: PSA  F/u in 2 wks for HTN, sooner if needed.  Grier Mitts, MD

## 2019-06-26 ENCOUNTER — Encounter: Payer: Self-pay | Admitting: Family Medicine

## 2019-06-27 NOTE — Telephone Encounter (Signed)
pT CALLED BACK ABOUT A QUESTION FROM YESTERDAY AND STATES THAT YOU CAN JUST TOUCH BASE OVER mycHART

## 2019-06-28 NOTE — Telephone Encounter (Signed)
Patient is calling Mendel Corning, about lab results, he siad he would be available sometime after 4:30.

## 2019-06-28 NOTE — Telephone Encounter (Signed)
Called pt left a message to return my call in the office regarding his questions about his lab results

## 2019-07-01 ENCOUNTER — Telehealth: Payer: Self-pay | Admitting: Family Medicine

## 2019-07-01 NOTE — Telephone Encounter (Signed)
Disregard

## 2019-07-10 ENCOUNTER — Encounter: Payer: BC Managed Care – PPO | Admitting: Family Medicine

## 2019-07-15 ENCOUNTER — Other Ambulatory Visit: Payer: Self-pay

## 2019-07-15 ENCOUNTER — Ambulatory Visit (HOSPITAL_COMMUNITY)
Admission: EM | Admit: 2019-07-15 | Discharge: 2019-07-15 | Disposition: A | Discharge status: Home / Self Care | Payer: BC Managed Care – PPO | Attending: Internal Medicine | Admitting: Internal Medicine

## 2019-07-15 ENCOUNTER — Encounter (HOSPITAL_COMMUNITY): Payer: Self-pay

## 2019-07-15 DIAGNOSIS — L739 Follicular disorder, unspecified: Secondary | ICD-10-CM | POA: Diagnosis not present

## 2019-07-15 MED ORDER — CEPHALEXIN 500 MG PO CAPS: 500.0000 mg | ORAL_CAPSULE | Freq: Four times a day (QID) | ORAL | 0 refills | Status: DC

## 2019-07-15 MED ORDER — IBUPROFEN 400 MG PO TABS: 400.0000 mg | ORAL_TABLET | Freq: Four times a day (QID) | ORAL | 0 refills | Status: DC | PRN

## 2019-07-15 NOTE — ED Triage Notes (Signed)
Patient presents to Urgent Care with complaints of abscess on the back of his head/ neck since a few days ago. Patient reports he has not noticed any drainage.

## 2019-07-16 NOTE — ED Provider Notes (Signed)
[facility]    CSN: [id number] Arrival date & time: 07/15/19  1737      History   Chief Complaint Chief Complaint  Patient presents with  . Abscess    HPI Jerome Hensley is a 44 y.o. male with history of hypertension, asthma comes to urgent care with complaints of painful swelling on the posterior aspect of the neck of a few days duration.  Patient has a history of folliculitis.  Pain is throbbing and constant.  It is aggravated by palpation.  He has not tried any over-the-counter medications.  No radiation of pain.  No discharge.  No fever or chills.Marland Kitchen   HPI  Past Medical History:  Diagnosis Date  . Allergy   . Asthma, chronic 07/14/2014  . Hypertension   . Seizures [facility])     Patient Active Problem List   Diagnosis Date Noted  . Generalized idiopathic epilepsy and epileptic syndromes, not intractable, without status epilepticus (HCC) 07/14/2014  . Asthma, chronic 07/14/2014  . Seasonal allergies 07/14/2014  . HTN (hypertension) 03/18/2012    Past Surgical History:  Procedure Laterality Date  . Mole excision    . WRIST ARTHROSCOPY Left 2003       Home Medications    Prior to Admission medications   Medication Sig Start Date End Date Taking? Authorizing Provider  aspirin 81 MG tablet Take 81 mg by mouth daily.    [provider]  carbamazepine (TEGRETOL) 200 MG tablet Take 1 tablet (200 mg total) by mouth 2 (two) times daily. 03/15/19   Van Clines, MD  cephALEXin (KEFLEX) 500 MG capsule Take 1 capsule (500 mg total) by mouth 4 (four) times daily. 07/15/19   Merrilee Jansky, MD  Cholecalciferol (VITAMIN D PO) Take 8,000 Units by mouth daily.    [provider]  eplerenone (INSPRA) 25 MG tablet Take 1 tablet (25 mg total) by mouth daily. 04/04/19   Mardella Layman, MD  furosemide (LASIX) 40 MG tablet Take 40 mg by mouth daily.    [provider]  hydrALAZINE (APRESOLINE) 100 MG tablet Take 100 mg by mouth 3 (three) times  daily.    [provider]  ibuprofen (ADVIL) 400 MG tablet Take 1 tablet (400 mg total) by mouth every 6 (six) hours as needed. 07/15/19   Merrilee Jansky, MD  losartan (COZAAR) 100 MG tablet Take 100 mg by mouth daily. 07/16/18   [provider]  metoprolol succinate (TOPROL-XL) 100 MG 24 hr tablet Take 1 tablet (100 mg total) by mouth daily. 04/04/19   Mardella Layman, MD  montelukast (SINGULAIR) 10 MG tablet Take 10 mg by mouth at bedtime.    [provider]  Multiple Vitamin (MULTIVITAMIN) capsule Take 1 capsule by mouth daily.    [provider]  Omega-3 Fatty Acids (FISH OIL) 1000 MG CAPS Take by mouth daily.    [provider]  TURMERIC PO Take by mouth.    [provider]    Family History Family History  Problem Relation Age of Onset  . Heart disease Mother   . Heart disease Maternal Uncle   . Arthritis Maternal Grandmother   . Prostate cancer Maternal Uncle   . Diabetes Maternal Uncle     Social History Social History   Tobacco Use  . Smoking status: Never Smoker  . Smokeless tobacco: Never Used  Substance Use Topics  . Alcohol use: No  . Drug use: No     Allergies   Patient  has no known allergies.   Review of Systems Review of Systems  Constitutional: Negative for activity change, chills and diaphoresis.  HENT: Negative.   Respiratory: Negative.   Gastrointestinal: Negative.   Genitourinary: Negative.   Musculoskeletal: Positive for myalgias and neck pain. Negative for arthralgias and neck stiffness.  Skin: Positive for color change and wound.  Neurological: Negative.  Negative for dizziness, weakness and numbness.     Physical Exam Triage Vital Signs ED Triage Vitals  Enc Vitals Group     BP 07/15/19 1845 (!) 179/113     Pulse Rate 07/15/19 1845 72     Resp 07/15/19 1845 15     Temp 07/15/19 1845 98.3 F (36.8 C)     Temp Source 07/15/19 1845 Oral     SpO2 07/15/19 1845 100 %     Weight --       Height --      Head Circumference --      Peak Flow --      Pain Score 07/15/19 1844 5     Pain Loc --      Pain Edu? --      Excl. in [place]? --    No data found.  Updated Vital Signs BP (!) 179/113 (BP Location: Right Arm)   Pulse 72   Temp 98.3 F (36.8 C) (Oral)   Resp 15   SpO2 100%   Visual Acuity Right Eye Distance:   Left Eye Distance:   Bilateral Distance:    Right Eye Near:   Left Eye Near:    Bilateral Near:     Physical Exam HENT:     Head:     Comments: Indurated swelling on the posterior aspect of the neck.  Swelling has significant induration with the longest diameter measuring 4 inches.  No fluctuance noted.  No punctum or discharge.  No lymphadenopathy palpated Cardiovascular:     Rate and Rhythm: Normal rate and regular rhythm.     Pulses: Normal pulses.     Heart sounds: Normal heart sounds.  Pulmonary:     Effort: Pulmonary effort is normal. No respiratory distress.     Breath sounds: Normal breath sounds. No wheezing or rhonchi.  Abdominal:     General: Bowel sounds are normal.  Musculoskeletal:        General: No deformity or signs of injury. Normal range of motion.     Cervical back: Normal range of motion. Tenderness present.  Lymphadenopathy:     Cervical: No cervical adenopathy.  Skin:    Capillary Refill: Capillary refill takes less than 2 seconds.      UC Treatments / Results  Labs (all labs ordered are listed, but only abnormal results are displayed) Labs Reviewed - No data to display  EKG   Radiology No results found.  Procedures Procedures (including critical care time)  Medications Ordered in UC Medications - No data to display  Initial Impression / Assessment and Plan / UC Course  I have reviewed the triage vital signs and the nursing notes.  Pertinent labs & imaging results that were available during my care of the patient were reviewed by me and considered in my medical decision making (see chart for details).      1.  Folliculitis in the posterior cervical area: There is no drainable abscess Keflex 500 mg 4 times daily for 5 days Ibuprofen 400 mg every 6 hours as needed for pain If patient notices discharge, worsening pain, worsening fever/chills or  neck stiffness he is advised to return to urgent care to be reevaluated. Final Clinical Impressions(s) / UC Diagnoses   Final diagnoses:  Folliculitis   Discharge Instructions   None    ED Prescriptions    Medication Sig Dispense Auth. Provider   cephALEXin (KEFLEX) 500 MG capsule Take 1 capsule (500 mg total) by mouth 4 (four) times daily. 20 capsule Jsiah Menta, Britta Mccreedy, MD   ibuprofen (ADVIL) 400 MG tablet Take 1 tablet (400 mg total) by mouth every 6 (six) hours as needed. 30 tablet Karalee Hauter, Britta Mccreedy, MD     PDMP not reviewed this encounter.   Merrilee Jansky, MD 07/16/19 [phone]

## 2019-07-22 ENCOUNTER — Telehealth: Payer: Self-pay | Admitting: Family Medicine

## 2019-07-22 NOTE — Telephone Encounter (Signed)
Left a message for pt to call the office and schedule appointment for Thursday open slot held for 4 pm

## 2019-07-22 NOTE — Telephone Encounter (Signed)
Pt was scheduled for an office appointment on Wed 07/24/2019

## 2019-07-22 NOTE — Telephone Encounter (Signed)
Pt has not had his TOC appt yet but he has an abscess at the bottom of his hairline. It has caused swelling around his neck/shoulders and is inflamed. He stated that he has been to Urgent care and they have given him antibiotic but wants Dr. Salomon Fick to look at. Pt would like to be seen ASAP.  Not sure if it's ok to schedule him with his TOC appt being set for 08/29/19.  Pt can be reached at [phone] OK to leave VM at that number or message him via mychart

## 2019-07-22 NOTE — Telephone Encounter (Signed)
Patient returned Nancy's call about the appointment for Thursday at 4 pm and stated that he wanted to come in on that day. Appointment scheduled by Harriett Sine.

## 2019-07-23 ENCOUNTER — Other Ambulatory Visit: Payer: Self-pay

## 2019-07-24 ENCOUNTER — Ambulatory Visit (INDEPENDENT_AMBULATORY_CARE_PROVIDER_SITE_OTHER): Payer: BC Managed Care – PPO | Admitting: Family Medicine

## 2019-07-24 ENCOUNTER — Encounter: Payer: Self-pay | Admitting: Family Medicine

## 2019-07-24 VITALS — BP 168/110 | HR 78 | Temp 97.9°F | Wt 248.0 lb

## 2019-07-24 DIAGNOSIS — L089 Local infection of the skin and subcutaneous tissue, unspecified: Secondary | ICD-10-CM

## 2019-07-24 DIAGNOSIS — I10 Essential (primary) hypertension: Secondary | ICD-10-CM | POA: Diagnosis not present

## 2019-07-24 DIAGNOSIS — L720 Epidermal cyst: Secondary | ICD-10-CM | POA: Diagnosis not present

## 2019-07-24 MED ORDER — DOXYCYCLINE HYCLATE 100 MG PO TABS: 100.0000 mg | ORAL_TABLET | Freq: Two times a day (BID) | ORAL | 0 refills | Status: DC

## 2019-07-24 NOTE — Patient Instructions (Signed)
Epidermal Cyst Removal  Epidermal cyst removal is a procedure to remove a sac of oily material (keratin) that has formed under your skin (epidermal cyst). Epidermal cysts may also be called epidermoid cysts, sebaceous cysts, or keratin cysts. Normally, the skin secretes this oily material through a gland or a hair follicle. However, when a skin gland or hair follicle becomes blocked, an epidermal cyst can form. You may need this procedure if you have an epidermal cyst that becomes large, uncomfortable, or infected. Tell a health care provider about:  Any allergies you have.  All medicines you are taking, including vitamins, herbs, eye drops, creams, and over-the-counter medicines.  Any problems you or family members have had with anesthetic medicines.  Any blood disorders you have.  Any surgeries you have had.  Any medical conditions you have now or have had.  Whether you are pregnant or may be pregnant. What are the risks? Generally, this is a safe procedure. However, problems may occur, including:  Development of another cyst.  Bleeding.  Infection.  Scarring. What happens before the procedure?  Ask your health care provider about: ? Changing or stopping your regular medicines. This is especially important if you are taking diabetes medicines or blood thinners. ? Taking medicines such as aspirin and ibuprofen. These medicines can thin your blood. Do not take these medicines unless your health care provider tells you to take them. ? Taking over-the-counter medicines, vitamins, herbs, and supplements.  If you have an infected cyst, you may have to take antibiotic medicine before the cyst removal. Take your antibiotic as told by your health care provider. Do not stop taking the antibiotic even if you start to feel better.  Take a shower on the morning of your procedure. Your health care provider may ask you to use a germ-killing soap. What happens during the procedure?   You  will be given a medicine to numb the area (local anesthetic).  The skin around the cyst will be cleaned with a germ-killing solution.  The health care provider will make a small incision in your skin over the cyst.  The health care provider will separate the cyst from the surrounding tissues that are under your skin.  If possible, the cyst will be removed undamaged (intact).  If the cyst bursts (ruptures), it will be removed in pieces.  After the cyst is removed, the health care provider will control any bleeding and close the incision with small stitches (sutures). Small incisions may not need sutures, and the bleeding will be controlled by applying direct pressure with gauze.  The health care provider may apply antibiotic ointment and a bandage (dressing) over the incision. The procedure may vary among health care providers and hospitals. What happens after the procedure?  If your cyst ruptured during the procedure, you may need an antibiotic. If you are prescribed an antibiotic medicine or ointment, take or apply it as told by your health care provider. Do not stop using the antibiotic even if you start to feel better. Summary  Epidermal cyst removal is a procedure to remove a sac of oily material (keratin) that has formed under your skin (epidermal cyst).  You may need this procedure if you have an epidermal cyst that becomes large, uncomfortable, or infected.  The health care provider will make a small incision in your skin to remove the cyst.  If you are prescribed an antibiotic medicine before the procedure, after the procedure, or both, use the antibiotic as told by your  health care provider. Do not stop using the antibiotic even if you start to feel better. This information is not intended to replace advice given to you by your health care provider. Make sure you discuss any questions you have with your health care provider. Document Revised: 09/13/2018 Document Reviewed:  03/16/2017 Elsevier Patient Education  2020 [employer]  Skin Abscess  A skin abscess is an infected area on or under your skin that contains a collection of pus and other material. An abscess may also be called a furuncle, carbuncle, or boil. An abscess can occur in or on almost any part of your body. Some abscesses break open (rupture) on their own. Most continue to get worse unless they are treated. The infection can spread deeper into the body and eventually into your blood, which can make you feel ill. Treatment usually involves draining the abscess. What are the causes? An abscess occurs when germs, like bacteria, pass through your skin and cause an infection. This may be caused by:  A scrape or cut on your skin.  A puncture wound through your skin, including a needle injection or insect bite.  Blocked oil or sweat glands.  Blocked and infected hair follicles.  A cyst that forms beneath your skin (sebaceous cyst) and becomes infected. What increases the risk? This condition is more likely to develop in people who:  Have a weak body defense system (immune system).  Have diabetes.  Have dry and irritated skin.  Get frequent injections or use illegal IV drugs.  Have a foreign body in a wound, such as a splinter.  Have problems with their lymph system or veins. What are the signs or symptoms? Symptoms of this condition include:  A painful, firm bump under the skin.  A bump with pus at the top. This may break through the skin and drain. Other symptoms include:  Redness surrounding the abscess site.  Warmth.  Swelling of the lymph nodes (glands) near the abscess.  Tenderness.  A sore on the skin. How is this diagnosed? This condition may be diagnosed based on:  A physical exam.  Your medical history.  A sample of pus. This may be used to find out what is causing the infection.  Blood tests.  Imaging tests, such as an ultrasound, CT scan, or MRI. How is  this treated? A small abscess that drains on its own may not need treatment. Treatment for larger abscesses may include:  Moist heat or heat pack applied to the area several times a day.  A procedure to drain the abscess (incision and drainage).  Antibiotic medicines. For a severe abscess, you may first get antibiotics through an IV and then change to antibiotics by mouth. Follow these instructions at home: Medicines   Take over-the-counter and prescription medicines only as told by your health care provider.  If you were prescribed an antibiotic medicine, take it as told by your health care provider. Do not stop taking the antibiotic even if you start to feel better. Abscess care   If you have an abscess that has not drained, apply heat to the affected area. Use the heat source that your health care provider recommends, such as a moist heat pack or a heating pad. ? Place a towel between your skin and the heat source. ? Leave the heat on for 20-30 minutes. ? Remove the heat if your skin turns bright red. This is especially important if you are unable to feel pain, heat, or cold. You  may have a greater risk of getting burned.  Follow instructions from your health care provider about how to take care of your abscess. Make sure you: ? Cover the abscess with a bandage (dressing). ? Change your dressing or gauze as told by your health care provider. ? Wash your hands with soap and water before you change the dressing or gauze. If soap and water are not available, use hand sanitizer.  Check your abscess every day for signs of a worsening infection. Check for: ? More redness, swelling, or pain. ? More fluid or blood. ? Warmth. ? More pus or a bad smell. General instructions  To avoid spreading the infection: ? Do not share personal care items, towels, or hot tubs with others. ? Avoid making skin contact with other people.  Keep all follow-up visits as told by your health care  provider. This is important. Contact a health care provider if you have:  More redness, swelling, or pain around your abscess.  More fluid or blood coming from your abscess.  Warm skin around your abscess.  More pus or a bad smell coming from your abscess.  A fever.  Muscle aches.  Chills or a general ill feeling. Get help right away if you:  Have severe pain.  See red streaks on your skin spreading away from the abscess. Summary  A skin abscess is an infected area on or under your skin that contains a collection of pus and other material.  A small abscess that drains on its own may not need treatment.  Treatment for larger abscesses may include having a procedure to drain the abscess and taking an antibiotic. This information is not intended to replace advice given to you by your health care provider. Make sure you discuss any questions you have with your health care provider. Document Revised: 09/13/2018 Document Reviewed: 07/06/2017 Elsevier Patient Education  2020 [employer].

## 2019-07-24 NOTE — Progress Notes (Signed)
Subjective:    Patient ID: Jerome Hensley, male    DOB: [birth year], 44 y.o.   MRN: [id number]  No chief complaint on file.   HPI Patient was seen today for f/u.  Pt seen 2/8 at [facility] for folliculitis. Advised nothing to drain. Given keflex x 7 days, but area in back on head/neck has become worse.  Pt notes difficulty sleeping and turning his head 2/2 discomfort.  BP continues to be elevated despite taking Toprol-XL 100 mg, losartan 100 mg, hydralazine 100 mg 3 times daily, Lasix 40 mg, eplerenone 25 mg.  Pt seen by nephrology, Dr. Madelon Lips.  Pt endorses concern.  Denies headaches, chest pain, SOB.  Renal u/s done in the past.  Pt mentions a h/o R knee pain after jumping over a fence several yrs ago.  Has seen Ortho for this.  Past Medical History:  Diagnosis Date  . Allergy   . Asthma, chronic 07/14/2014  . Hypertension   . Seizures (HCC)     No Known Allergies  ROS General: Denies fever, chills, night sweats, changes in weight, changes in appetite HEENT: Denies headaches, ear pain, changes in vision, rhinorrhea, sore throat CV: Denies CP, palpitations, SOB, orthopnea Pulm: Denies SOB, cough, wheezing GI: Denies abdominal pain, nausea, vomiting, diarrhea, constipation GU: Denies dysuria, hematuria, frequency, vaginal discharge Msk: Denies muscle cramps, joint pains  +R knee pain Neuro: Denies weakness, numbness, tingling  Skin: Denies rashes, bruising  +abscess on posterior neck Psych: Denies depression, anxiety, hallucinations     Objective:    Blood pressure (!) 168/110, pulse 78, temperature 97.9 F (36.6 C), temperature source Temporal, weight 248 lb (112.5 kg), SpO2 99 %.  Gen. Pleasant, well-nourished, in no distress, normal affect   HEENT: [place]/AT, face symmetric, no scleral icterus, PERRLA, EOMI, nares patent without drainage Lungs: no accessory muscle use, CTAB Cardiovascular: RRR, no m/r/g, no peripheral edema Neuro:  A&Ox3, CN II-XII intact, normal  gait Skin:  Warm, no rash.  Edema, increased warmth, induration of posterior neck to lateral neck b/l.  Small fluctuant area ~ 7 mm midline inferior to occipital protuberance.  2 small pustules superior lateral to area of fluctuance.          Wt Readings from Last 3 Encounters:  06/20/19 252 lb (114.3 kg)  03/15/19 255 lb 8 oz (115.9 kg)  09/08/17 251 lb (113.9 kg)    Lab Results  Component Value Date   WBC 5.1 06/20/2019   HGB 14.6 06/20/2019   HCT 42.5 06/20/2019   PLT 360.0 06/20/2019   GLUCOSE 90 06/20/2019   CHOL 172 06/20/2019   TRIG 100.0 06/20/2019   HDL 59.70 06/20/2019   LDLCALC 92 06/20/2019   NA 139 06/20/2019   K 4.2 06/20/2019   CL 102 06/20/2019   CREATININE 1.05 06/20/2019   BUN 12 06/20/2019   CO2 29 06/20/2019   PSA 1.03 06/20/2019   HGBA1C 5.3 06/20/2019    Incision and Drainage Procedure Note  Pre-operative Diagnosis: Abscess  Post-Op Diagnosis: Infected epidermoid cyst  Indications: Abscess  Anesthesia: 1% lidocaine with epinephrine  Procedure Details  The procedure, risks and complications have been discussed in detail (including, but not limited to airway compromise, infection, bleeding) with the patient, and the patient has signed consent to the procedure.  The skin was sterilely prepped and draped over the affected area in the usual fashion. After adequate local anesthesia, I&D with a #11 blade was performed on the midline posterior neck. Purulent drainage and thick keratin  debris: present.  Cyst sac removed.  Due to the degree of induration and area affected pt given a 10 min break in the middle of the procedure.   The patient was observed until stable.  EBL: minimal cc's Condition: Tolerated procedure well and Stable Complications: none.  Assessment/Plan:  Infected epidermoid cyst  -consent obtained.  I&D performed.  See procedure note above. -warm compresses, Tylenol prn for pain or discomfort. -given handouts -f/u in 2  days -given precautions - Plan: doxycycline (VIBRA-TABS) 100 MG tablet  Essential hypertension -uncontrolled -Ultrasound renal arteries 10/25/2018: Negative for significant renal artery stenosis.  No acute renal finding or abnormality by ultrasound. -continue current medications: 25 mg, Lasix 40 mg, hydralazine 100 mg, losartan 100 mg, Toprol-XL 100 mg -Discussed adjusting medications, however will wait until resolution of current infection. -Lifestyle modification strongly encouraged including decreasing sodium intake and increasing physical activity -We will place referral to hypertension clinic  - Plan: Ambulatory referral to Nephrology  F/u in 2 days  More than 50% of over 65 minutes spent in total was spent face-to-face with the patient, counseling and/or coordinating care.    Abbe Amsterdam, MD

## 2019-07-26 ENCOUNTER — Telehealth (INDEPENDENT_AMBULATORY_CARE_PROVIDER_SITE_OTHER): Payer: BC Managed Care – PPO | Admitting: Family Medicine

## 2019-07-26 DIAGNOSIS — I10 Essential (primary) hypertension: Secondary | ICD-10-CM

## 2019-07-26 DIAGNOSIS — L720 Epidermal cyst: Secondary | ICD-10-CM

## 2019-07-26 DIAGNOSIS — L089 Local infection of the skin and subcutaneous tissue, unspecified: Secondary | ICD-10-CM

## 2019-07-26 NOTE — Progress Notes (Signed)
Virtual Visit via Video Note  I connected with Jerome (pronounced "Tal-madj") Hensley on 07/26/19 at  4:30 PM EST by a video enabled telemedicine application 2/2 COVID-19 pandemic and verified that I am speaking with the correct person using two identifiers.  Location patient: home Location provider:work or home office Persons participating in the virtual visit: patient, provider  I discussed the limitations of evaluation and management by telemedicine and the availability of in person appointments. The patient expressed understanding and agreed to proceed.   HPI: Pt is a 44 yo male with pmh sig for allergies, asthma, HTN, seizure d/o seen for f/u.  Pt doing well since OFV 2 days ago, s/p I&D for infected epidermoid cyst on posterior neck/inferior occipital area.  Pt taking Doxycycline BID, day 2/10.  Notes pain has subsided, sleeping better, and able to turn his head.  Area is draining some.   Pt denies fever, chills, n/v, HAs.  Skin on posterior neck is dry and pruritic.  Keeping area clean and dry.  Pt is using neosporin.  Pt has not checked his bp recently.   ROS: See pertinent positives and negatives per HPI.  Past Medical History:  Diagnosis Date  . Allergy   . Asthma, chronic 07/14/2014  . Hypertension   . Seizures (HCC)     Past Surgical History:  Procedure Laterality Date  . Mole excision    . WRIST ARTHROSCOPY Left 2003    Family History  Problem Relation Age of Onset  . Heart disease Mother   . Heart disease Maternal Uncle   . Arthritis Maternal Grandmother   . Prostate cancer Maternal Uncle   . Diabetes Maternal Uncle      Current Outpatient Medications:  .  aspirin 81 MG tablet, Take 81 mg by mouth daily., Disp: , Rfl:  .  carbamazepine (TEGRETOL) 200 MG tablet, Take 1 tablet (200 mg total) by mouth 2 (two) times daily., Disp: 180 tablet, Rfl: 3 .  Cholecalciferol (VITAMIN D PO), Take 8,000 Units by mouth daily., Disp: , Rfl:  .  doxycycline (VIBRA-TABS) 100  MG tablet, Take 1 tablet (100 mg total) by mouth 2 (two) times daily., Disp: 20 tablet, Rfl: 0 .  furosemide (LASIX) 40 MG tablet, Take 40 mg by mouth daily., Disp: , Rfl:  .  hydrALAZINE (APRESOLINE) 100 MG tablet, Take 100 mg by mouth 3 (three) times daily., Disp: , Rfl:  .  ibuprofen (ADVIL) 400 MG tablet, Take 1 tablet (400 mg total) by mouth every 6 (six) hours as needed., Disp: 30 tablet, Rfl: 0 .  losartan (COZAAR) 100 MG tablet, Take 100 mg by mouth daily., Disp: , Rfl:  .  metoprolol succinate (TOPROL-XL) 100 MG 24 hr tablet, Take 1 tablet (100 mg total) by mouth daily., Disp: 30 tablet, Rfl: 1 .  montelukast (SINGULAIR) 10 MG tablet, Take 10 mg by mouth at bedtime., Disp: , Rfl:  .  Multiple Vitamin (MULTIVITAMIN) capsule, Take 1 capsule by mouth daily., Disp: , Rfl:  .  Omega-3 Fatty Acids (FISH OIL) 1000 MG CAPS, Take by mouth daily., Disp: , Rfl:  .  TURMERIC PO, Take by mouth., Disp: , Rfl:   EXAM:  VITALS per patient if applicable:  RR between 12-20 bpm  GENERAL: alert, oriented, appears well and in no acute distress  HEENT: atraumatic, conjunctiva clear, no obvious abnormalities on inspection of external nose and ears  NECK: normal movements of the head and neck  LUNGS: on inspection no signs of respiratory distress,  breathing rate appears normal, no obvious gross SOB, gasping or wheezing  CV: no obvious cyanosis  MS: moves all visible extremities without noticeable abnormality.  Pt rotates head, flexes and extends neck with ease.  SKIN:  Midline posterior neck visibly dry with desquamination, no drainage appreciated.  Neck visibly smaller than previous visit.  PSYCH/NEURO: pleasant and cooperative, no obvious depression or anxiety, speech and thought processing grossly intact  ASSESSMENT AND PLAN:  Discussed the following assessment and plan:  Infected epidermoid cyst  -continue Doxycycline 100 mg BID x 10 days.  Day 2/10. -continue to keep area clean and  dry. -continue supportive care: heat -given precautions -f/u next wk  HTN -referral to [facility] hypertension clinic placed -pt encouraged to check bp daily -continue lifestyle modifications -continue Hydralazine 100 mg TID, losartan 100 mg, Toprol XL 100 mg, lasix 40 mg, and eplerenone 25 mg -given precautions  F/u next wk   I discussed the assessment and treatment plan with the patient. The patient was provided an opportunity to ask questions and all were answered. The patient agreed with the plan and demonstrated an understanding of the instructions.   The patient was advised to call back or seek an in-person evaluation if the symptoms worsen or if the condition fails to improve as anticipated.   Billie Ruddy, MD

## 2019-08-29 ENCOUNTER — Ambulatory Visit: Payer: BC Managed Care – PPO | Admitting: Family Medicine

## 2019-08-30 DIAGNOSIS — I10 Essential (primary) hypertension: Secondary | ICD-10-CM | POA: Diagnosis not present

## 2019-08-30 DIAGNOSIS — R569 Unspecified convulsions: Secondary | ICD-10-CM | POA: Diagnosis not present

## 2019-08-30 DIAGNOSIS — J45909 Unspecified asthma, uncomplicated: Secondary | ICD-10-CM | POA: Diagnosis not present

## 2019-09-13 DIAGNOSIS — N182 Chronic kidney disease, stage 2 (mild): Secondary | ICD-10-CM | POA: Diagnosis not present

## 2019-09-13 DIAGNOSIS — E669 Obesity, unspecified: Secondary | ICD-10-CM | POA: Diagnosis not present

## 2019-09-13 DIAGNOSIS — I10 Essential (primary) hypertension: Secondary | ICD-10-CM | POA: Diagnosis not present

## 2019-09-13 DIAGNOSIS — Z6835 Body mass index (BMI) 35.0-35.9, adult: Secondary | ICD-10-CM | POA: Diagnosis not present

## 2019-09-13 DIAGNOSIS — E269 Hyperaldosteronism, unspecified: Secondary | ICD-10-CM | POA: Diagnosis not present

## 2019-10-06 ENCOUNTER — Other Ambulatory Visit: Payer: Self-pay | Admitting: Neurology

## 2019-10-21 DIAGNOSIS — E269 Hyperaldosteronism, unspecified: Secondary | ICD-10-CM | POA: Diagnosis not present

## 2019-10-21 DIAGNOSIS — I10 Essential (primary) hypertension: Secondary | ICD-10-CM | POA: Diagnosis not present

## 2019-11-08 DIAGNOSIS — E669 Obesity, unspecified: Secondary | ICD-10-CM | POA: Diagnosis not present

## 2019-11-08 DIAGNOSIS — E269 Hyperaldosteronism, unspecified: Secondary | ICD-10-CM | POA: Diagnosis not present

## 2019-11-08 DIAGNOSIS — N182 Chronic kidney disease, stage 2 (mild): Secondary | ICD-10-CM | POA: Diagnosis not present

## 2019-11-08 DIAGNOSIS — I10 Essential (primary) hypertension: Secondary | ICD-10-CM | POA: Diagnosis not present

## 2019-11-08 DIAGNOSIS — Z6835 Body mass index (BMI) 35.0-35.9, adult: Secondary | ICD-10-CM | POA: Diagnosis not present

## 2020-03-16 ENCOUNTER — Encounter: Payer: Self-pay | Admitting: Neurology

## 2020-03-16 ENCOUNTER — Other Ambulatory Visit: Payer: Self-pay

## 2020-03-16 ENCOUNTER — Ambulatory Visit (INDEPENDENT_AMBULATORY_CARE_PROVIDER_SITE_OTHER): Payer: BC Managed Care – PPO | Admitting: Neurology

## 2020-03-16 VITALS — BP 168/119 | HR 97 | Ht 71.0 in | Wt 256.6 lb

## 2020-03-16 DIAGNOSIS — G40309 Generalized idiopathic epilepsy and epileptic syndromes, not intractable, without status epilepticus: Secondary | ICD-10-CM | POA: Diagnosis not present

## 2020-03-16 MED ORDER — CARBAMAZEPINE 200 MG PO TABS: 200.0000 mg | ORAL_TABLET | Freq: Two times a day (BID) | ORAL | 3 refills | Status: DC

## 2020-03-16 NOTE — Patient Instructions (Signed)
Always good to see you. Continue carbamazepine 200mg  twice a day. Let know when sleep study recommended by your cardiologist and we can order it. Follow-up in 1 year, call for any changes.    Seizure Precautions: 1. If medication has been prescribed for you to prevent seizures, take it exactly as directed.  Do not stop taking the medicine without talking to your doctor first, even if you have not had a seizure in a long time.   2. Avoid activities in which a seizure would cause danger to yourself or to others.  Don't operate dangerous machinery, swim alone, or climb in high or dangerous places, such as on ladders, roofs, or girders.  Do not drive unless your doctor says you may.  3. If you have any warning that you may have a seizure, lay down in a safe place where you can't hurt yourself.    4.  No driving for 6 months from last seizure, as per [facility].   Please refer to the following link on the Epilepsy Foundation of America's website for more information: http://www.epilepsyfoundation.org/answerplace/Social/driving/drivingu.cfm   5.  Maintain good sleep hygiene. Avoid alcohol.  6.  Contact your doctor if you have any problems that may be related to the medicine you are taking.  7.  Call 911 and bring the patient back to the ED if:        A.  The seizure lasts longer than 5 minutes.       B.  The patient doesn't awaken shortly after the seizure  C.  The patient has new problems such as difficulty seeing, speaking or moving  D.  The patient was injured during the seizure  E.  The patient has a temperature over 102 F (39C)  F.  The patient vomited and now is having trouble breathing

## 2020-03-16 NOTE — Progress Notes (Signed)
NEUROLOGY FOLLOW UP OFFICE NOTE  Jerome Hensley [id number] 1975/09/27  HISTORY OF PRESENT ILLNESS: I had the pleasure of seeing Jerome Hensley in follow-up in the neurology clinic on 03/16/2020.  The patient was last seen a year ago for seizures. He is alone in the office today. We had agreed to hold off on EEG since he had been doing well with no seizures since 09/2017 when carbamazepine was restarted. He denies any staring/unresponsive episodes, gaps in time, olfactory/gustatory hallucinations, no nocturnal seizures. He had a couple of hours of jerking in the spring time that may have been related to poor sleep. Sleep during weekdays is interrupted due to external factors (son up and down), sleep is good on the weekends. He has daytime drowsiness every now and then. He snores and in the spring time his wife was telling him he would have brief apneic episodes. He is working with his cardiologist on elevated BP, it is 168/119 today. He will be undergoing workup for hyperaldosteronism, sleep study may be recommended by cardiologist if workup unremarkable. He denies any headaches, dizziness. The underside of his left arm has occasional numbness and tingling. No falls. Mood is good.    History on Initial Assessment 09/08/2017: This is a very pleasant 44 year old right-handed man with a history of hypertension, asthma, and seizures, presenting to establish care. He reports a history of seizures between the ages of 1 to 1 where he was told he would having staring spells and generalized convulsions. He was taking a medication called Slobid(?) which helped with the seizures, this was discontinued, he reports that seizures stopped with prayers and medication was stopped. He did well with no seizures until his senior year in college at age 49 or 6 when he was sleep deprived he would have episodes of shaking of the upper extremities where his arms would jerk and he could not move them for 15 seconds to 2  minutes. He states he would be conscious during them, legs are not affected, he would not fall if they occurred standing. He had them on and off when he would work night shifts, occurring in the early morning hours, sometimes waking him up from sleep. He did not take any medication until he started seeing neurologist Dr. Adelene Idler in the mid-2000s and was started on carbamazepine 200mg  BID. He was not having the seizures while on carbamazepine, no side effects, but stopped the medication 4-5 years ago after he stopped seeing Dr. . He has not had any of the prior symptoms he was having when younger, but now his wife feels he is having nocturnal seizures. He wakes up and shakes a little like he is stretching his arms. He states they are not like the seizures in college, "it's more like you yawn and stretch, then arms are stiff." He goes back to sleep but states he remembers everything. They mostly occur in the hours between 3-5AM. His wife has told him she feels him stiffening, but they do not speak to each other. He has not bitten his tongue or had urinary incontinence. These do not happen in the daytime. He denies any staring/unresponsive episodes, gaps in time, focal numbness/tingling/weakness. He recalls olfactory and epigastric/nausea symptoms prior to his seizures in college where "air would smell different," this has not happened recently.   He has a history of right-sided Bell's palsy in 2004 with mild residual facial weakness and synkinesis. He has low back pain. He has dry mouth (which he had with carbamazepine  in the past, now attributes to BP medication). He denies any headaches, dizziness, diplopia, dysarthria/dysphagia, neck pain, bowel/bladder dysfunction. He works in Consulting civil engineer and drives. He was told he had a normal MRI and EEG, as well as sleep study in the mid-2000s with Dr. Adelene Idler, records requested for review.   Epilepsy Risk Factors:  He had a normal birth and early development.  There is no  history of febrile convulsions, CNS infections such as meningitis/encephalitis, significant traumatic brain injury, neurosurgical procedures, or family history of seizures  PAST MEDICAL HISTORY: Past Medical History:  Diagnosis Date  . Allergy   . Asthma, chronic 07/14/2014  . Hypertension   . Seizures (HCC)     MEDICATIONS: Current Outpatient Medications on File Prior to Visit  Medication Sig Dispense Refill  . aspirin 81 MG tablet Take 81 mg by mouth daily.    . carbamazepine (TEGRETOL) 200 MG tablet TAKE 1 TABLET TWICE A DAY 180 tablet 3  . Cholecalciferol (VITAMIN D PO) Take 8,000 Units by mouth daily.    . furosemide (LASIX) 40 MG tablet Take 40 mg by mouth daily.    . hydrALAZINE (APRESOLINE) 100 MG tablet Take 100 mg by mouth 2 (two) times daily.     Marland Kitchen ibuprofen (ADVIL) 400 MG tablet Take 1 tablet (400 mg total) by mouth every 6 (six) hours as needed. 30 tablet 0  . metoprolol succinate (TOPROL-XL) 100 MG 24 hr tablet Take 1 tablet (100 mg total) by mouth daily. 30 tablet 1  . montelukast (SINGULAIR) 10 MG tablet Take 10 mg by mouth at bedtime.    . Multiple Vitamin (MULTIVITAMIN) capsule Take 1 capsule by mouth daily.    . Omega-3 Fatty Acids (FISH OIL) 1000 MG CAPS Take by mouth daily.    Marland Kitchen telmisartan (MICARDIS) 80 MG tablet Take 80 mg by mouth daily.    . TURMERIC PO Take by mouth.    . [DISCONTINUED] eplerenone (INSPRA) 25 MG tablet Take 1 tablet (25 mg total) by mouth daily. 30 tablet 1   No current facility-administered medications on file prior to visit.    ALLERGIES: No Known Allergies  FAMILY HISTORY: Family History  Problem Relation Age of Onset  . Heart disease Mother   . Heart disease Maternal Uncle   . Arthritis Maternal Grandmother   . Prostate cancer Maternal Uncle   . Diabetes Maternal Uncle     SOCIAL HISTORY: Social History   Socioeconomic History  . Marital status: Married    Spouse name: Not on file  . Number of children: Not on file  .  Years of education: Not on file  . Highest education level: Not on file  Occupational History  . Not on file  Tobacco Use  . Smoking status: Never Smoker  . Smokeless tobacco: Never Used  Vaping Use  . Vaping Use: Never used  Substance and Sexual Activity  . Alcohol use: No  . Drug use: No  . Sexual activity: Yes    Partners: Female  Other Topics Concern  . 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  Difficulty of Paying Living Expenses: Not on file  Food Insecurity:   . Worried About Programme researcher, broadcasting/film/video in the Last Year: Not on file  . Ran Out of Food in the Last Year: Not on file  Transportation Needs:   . Lack of Transportation (Medical): Not on file  . Lack of Transportation (Non-Medical): Not on file  Physical Activity:   . Days of Exercise per Week: Not on file  . Minutes of Exercise per Session: Not on file  Stress:   . Feeling of Stress : Not on file  Social Connections:   . Frequency of Communication with Friends and Family: Not on file  . Frequency of Social Gatherings with Friends and Family: Not on file  . Attends Religious Services: Not on file  . Active Member of Clubs or Organizations: Not on file  . Attends Banker Meetings: Not on file  . Marital Status: Not on file  Intimate Partner Violence:   . Fear of Current or Ex-Partner: Not on file  . Emotionally Abused: Not on file  . Physically Abused: Not on file  . Sexually Abused: Not on file     PHYSICAL EXAM: Vitals:   03/16/20 0837  BP: (!) 168/119  Pulse: 97  SpO2: 99%   General: No acute distress Head:  Normocephalic/atraumatic Skin/Extremities: No rash, no edema Neurological  Exam: alert and oriented to person, place, and time. No aphasia or dysarthria. Fund of knowledge is appropriate.  Recent and remote memory are intact.  Attention and concentration are normal.   Cranial nerves: Pupils equal, round. Extraocular movements intact with no nystagmus. Visual fields full.  No facial asymmetry.  Motor: Bulk and tone normal, muscle strength 5/5 throughout with no pronator drift.  Reflexes +2 throughout. Finger to nose testing intact.  Gait narrow-based and steady, able to tandem walk adequately.    IMPRESSION: This is a very pleasant 44 yo RH man with a history of hypertension, asthma, seizures since childhood, suggestive of generalized epilepsy. He has been seizure-free since restarting carbamazepine 200mg  BID in 09/2017. No side effects, refills sent. He continues to deal with uncontrolled hypertension and will be undergoing workup for hyperaldosteronism, if negative, sleep study has been recommended. He would like it done through our office and will let 10/2017 know when his cardiologist would like to proceed with this. He is aware of [place] driving laws to stop driving after a seizure until 6 months seizure-free. Follow-up in 1 year, he knows to call for any changes.   Thank you for allowing me to participate in his care.  Please do not hesitate to call for any questions or concerns.   [place], M.D.   CC: Dr. Patrcia Dolly

## 2020-06-24 DIAGNOSIS — I10 Essential (primary) hypertension: Secondary | ICD-10-CM | POA: Diagnosis not present

## 2020-06-24 DIAGNOSIS — J9811 Atelectasis: Secondary | ICD-10-CM | POA: Diagnosis not present

## 2020-06-24 DIAGNOSIS — E279 Disorder of adrenal gland, unspecified: Secondary | ICD-10-CM | POA: Diagnosis not present

## 2020-07-29 IMAGING — US ULTRASOUND RENAL ARTERY STENOSIS
1 series · 13 of 25 positions shown · non-contrast
Comparison: None.

CLINICAL DATA: Hypertension

EXAM:
RENAL/URINARY TRACT ULTRASOUND
RENAL DUPLEX DOPPLER ULTRASOUND

[Series 1: ultrasound renal artery stenosis · 0.30mm/px · 13 of 74 slices shown]
[im 1/74]
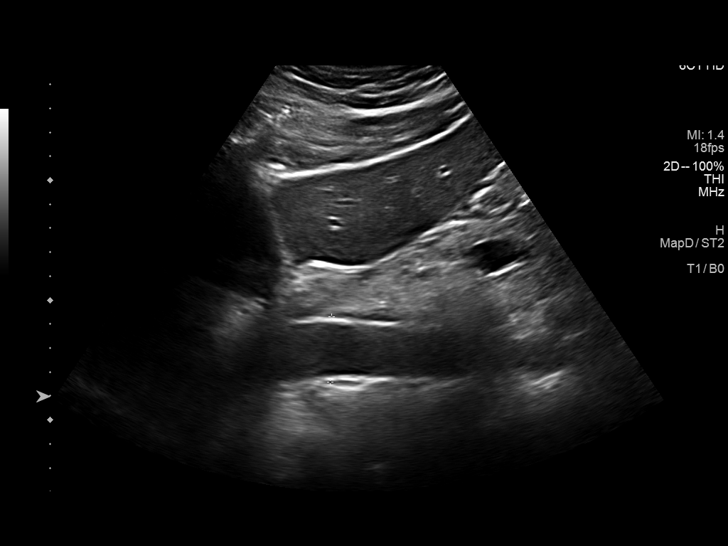
[im 7/74]
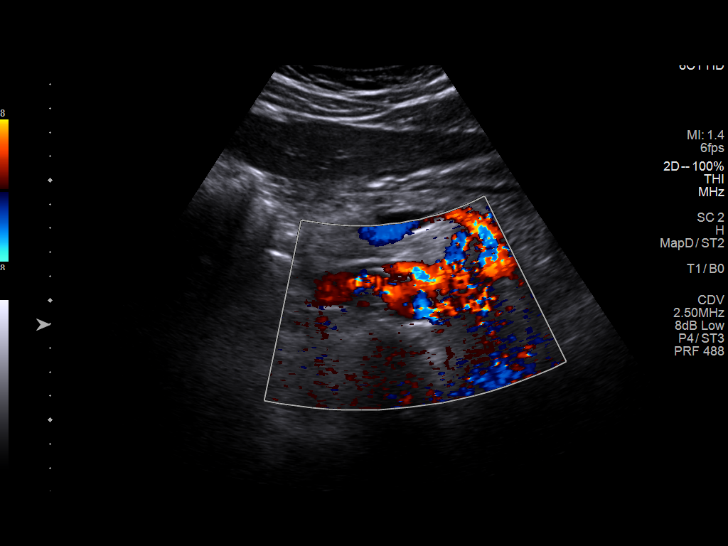
[im 13/74]
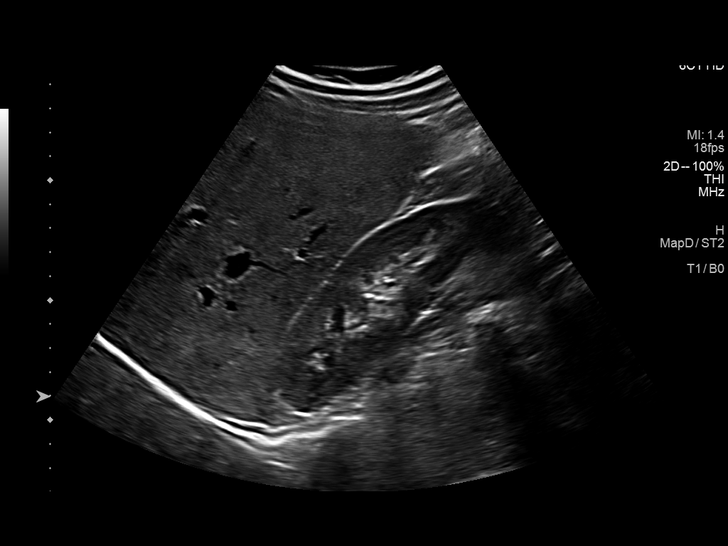
[im 19/74]
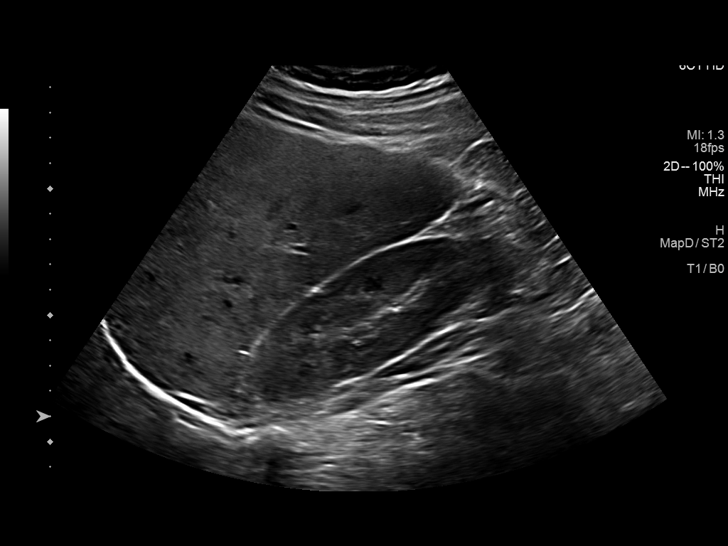
[im 25/74]
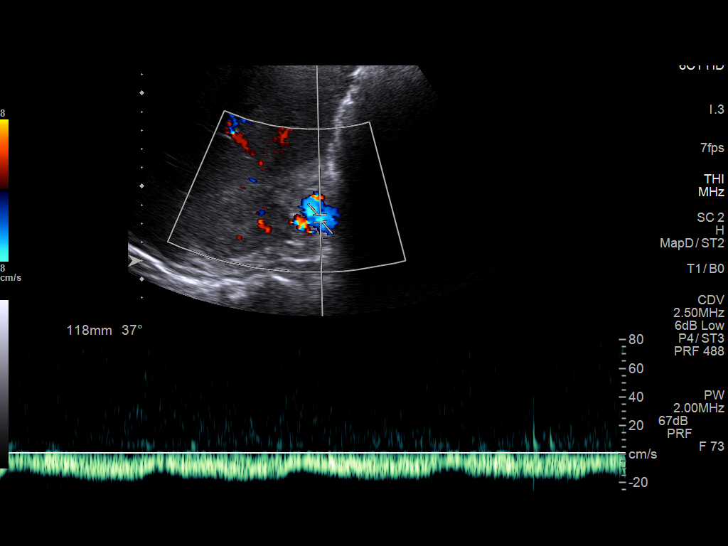
[im 31/74]
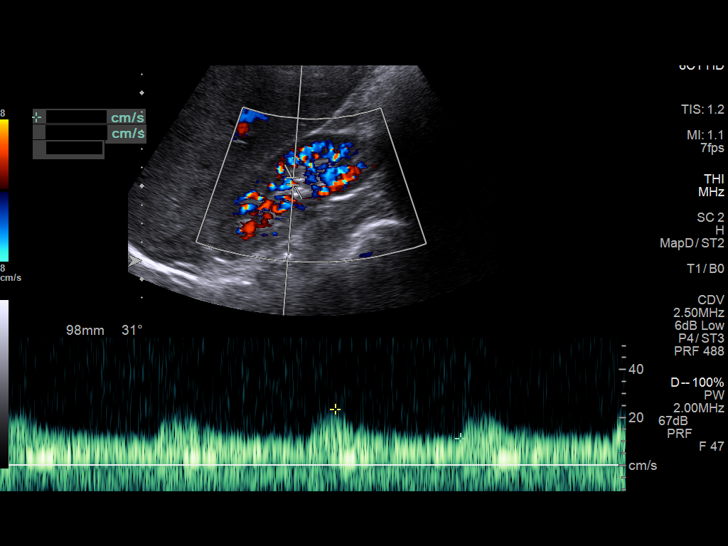
[im 37/74]
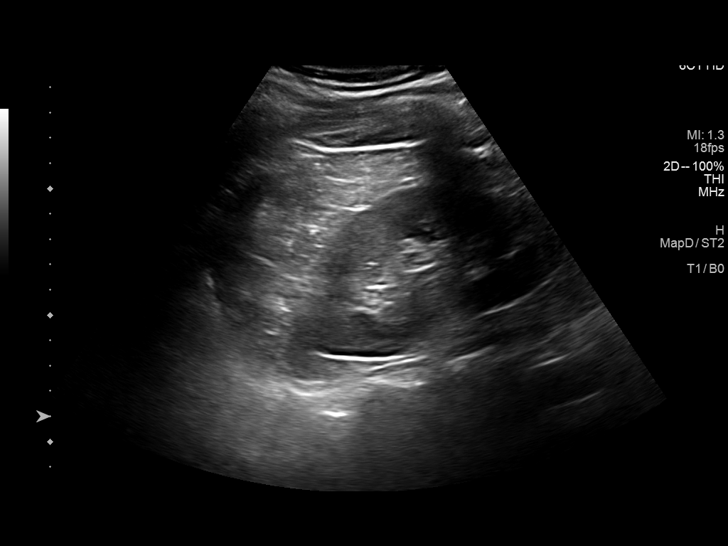
[im 43/74]
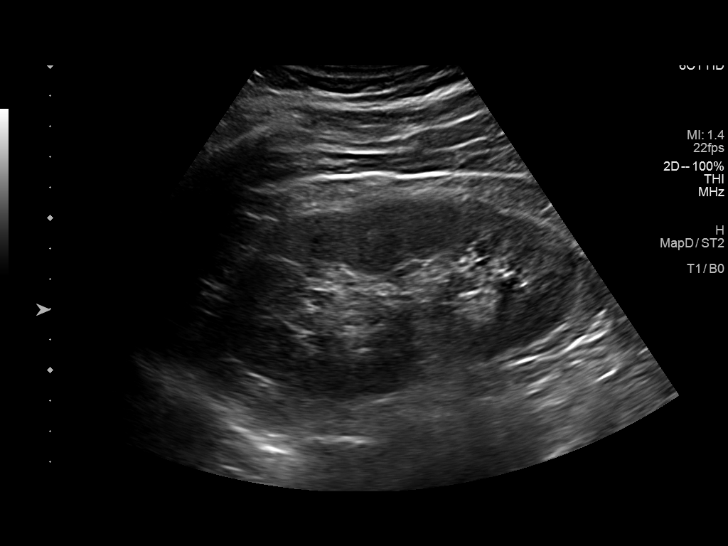
[im 49/74]
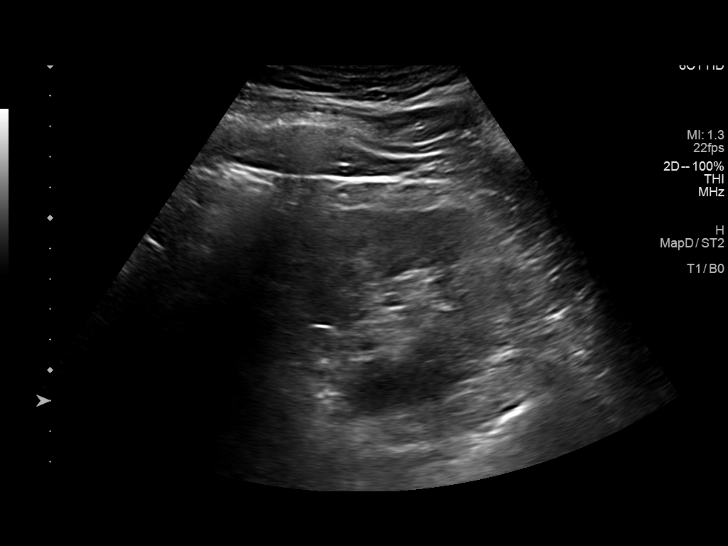
[im 55/74]
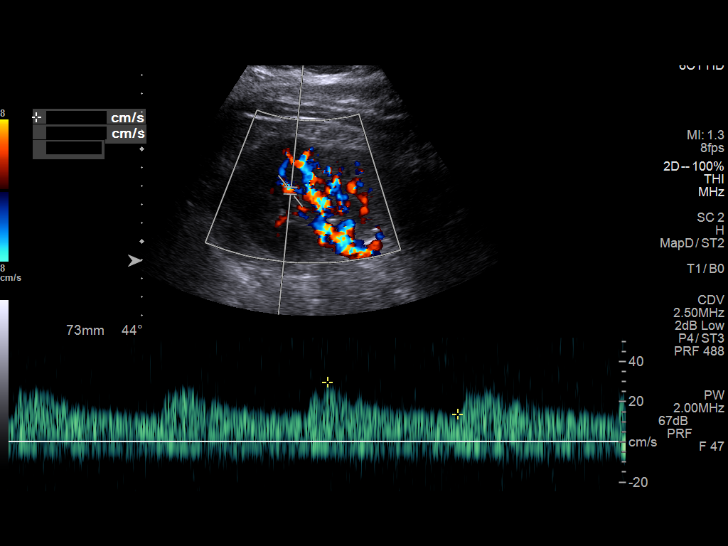
[im 61/74]
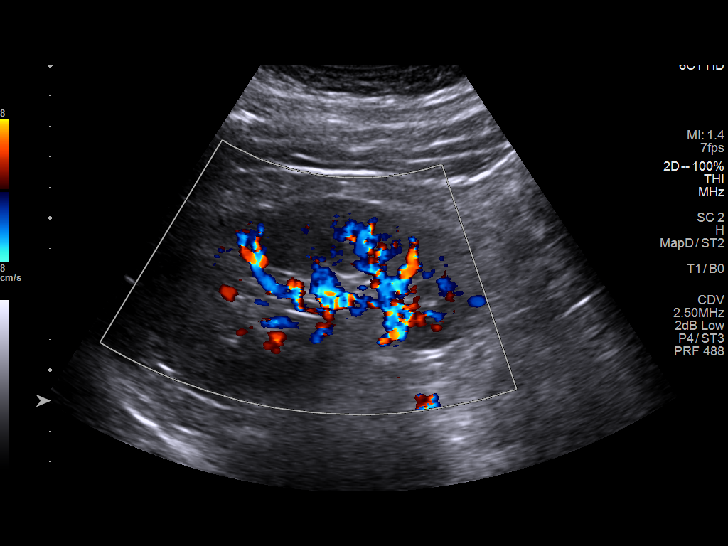
[im 67/74]
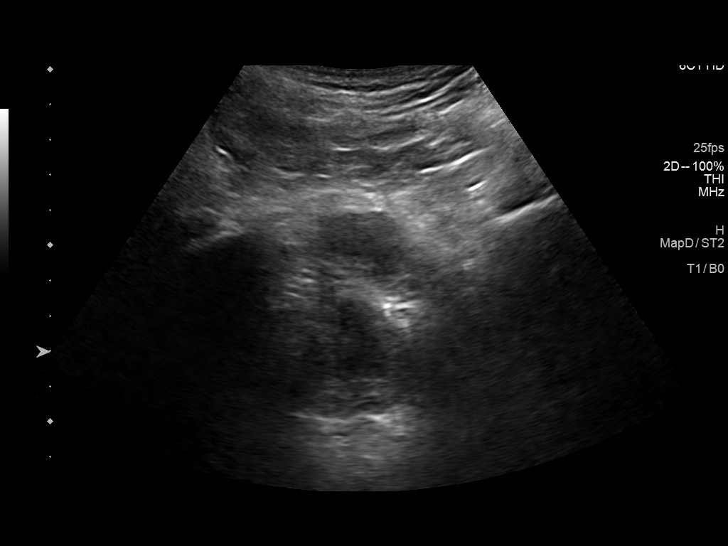
[im 74/74]
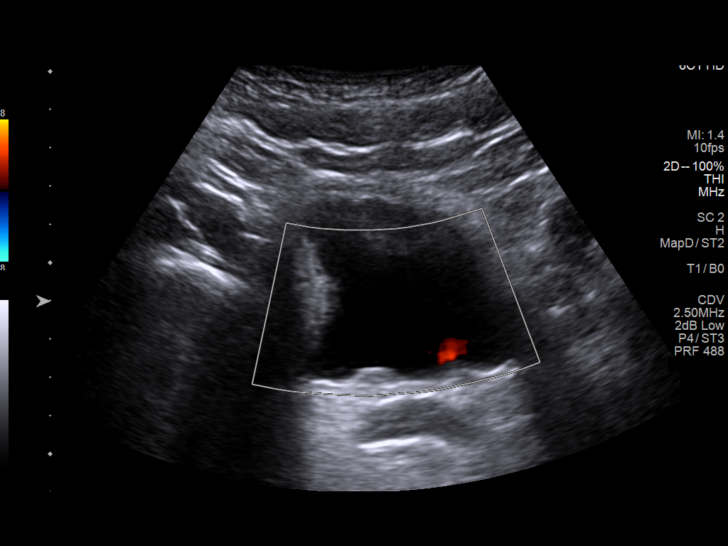

[13 of 25 positions shown; findings below may reference images not displayed]

FINDINGS: Right Kidney:

Length: 11.5 cm. Echogenicity within normal limits. No mass or
hydronephrosis visualized.

Left Kidney:

Length: 12.1 cm. Echogenicity within normal limits. No mass or
hydronephrosis visualized.

Bladder: Normal by ultrasound. Ureteral jets demonstrated
bilaterally. Large prostate measuring 4.0 x 3.6 x 4.0 cm

RENAL DUPLEX ULTRASOUND

Right Renal Artery Velocities:

Origin:  111 cm/sec

Mid:  82 cm/sec

Hilum:  88 cm/sec

Interlobar:  39 cm/sec

Arcuate:  27 cm/sec

Left Renal Artery Velocities:

Origin:  96 cm/sec

Mid:  66 cm/sec

Hilum:  56 cm/sec

Interlobar:  39 cm/sec

Arcuate:  22 cm/sec

Aortic Velocity:  104 cm/sec

Right Renal-Aortic Ratios:

Origin:

Mid:

Hilum:

Interlobar:

Arcuate:

Left Renal-Aortic Ratios:

Origin:

Mid:

Hilum:

Interlobar:

Arcuate:

No significant peak systolic velocity abnormality or abnormal ratio
to suggest renal artery stenosis by ultrasound duplex exam. Renal
arteries and veins appear patent. No other acute finding by
ultrasound.
IMPRESSION: Negative for significant renal artery stenosis by renal duplex.

No acute renal finding or abnormality by ultrasound.

Enlarged prostate

## 2020-12-31 DIAGNOSIS — E269 Hyperaldosteronism, unspecified: Secondary | ICD-10-CM | POA: Diagnosis not present

## 2020-12-31 DIAGNOSIS — I10 Essential (primary) hypertension: Secondary | ICD-10-CM | POA: Diagnosis not present

## 2021-02-05 DIAGNOSIS — Z6835 Body mass index (BMI) 35.0-35.9, adult: Secondary | ICD-10-CM | POA: Diagnosis not present

## 2021-02-05 DIAGNOSIS — I10 Essential (primary) hypertension: Secondary | ICD-10-CM | POA: Diagnosis not present

## 2021-02-05 DIAGNOSIS — N182 Chronic kidney disease, stage 2 (mild): Secondary | ICD-10-CM | POA: Diagnosis not present

## 2021-02-05 DIAGNOSIS — E269 Hyperaldosteronism, unspecified: Secondary | ICD-10-CM | POA: Diagnosis not present

## 2021-02-05 DIAGNOSIS — E669 Obesity, unspecified: Secondary | ICD-10-CM | POA: Diagnosis not present

## 2021-02-23 DIAGNOSIS — I129 Hypertensive chronic kidney disease with stage 1 through stage 4 chronic kidney disease, or unspecified chronic kidney disease: Secondary | ICD-10-CM | POA: Diagnosis not present

## 2021-02-23 DIAGNOSIS — Z87891 Personal history of nicotine dependence: Secondary | ICD-10-CM | POA: Diagnosis not present

## 2021-02-23 DIAGNOSIS — E876 Hypokalemia: Secondary | ICD-10-CM | POA: Diagnosis not present

## 2021-02-23 DIAGNOSIS — R569 Unspecified convulsions: Secondary | ICD-10-CM | POA: Diagnosis not present

## 2021-02-23 DIAGNOSIS — I10 Essential (primary) hypertension: Secondary | ICD-10-CM | POA: Diagnosis not present

## 2021-02-23 DIAGNOSIS — N182 Chronic kidney disease, stage 2 (mild): Secondary | ICD-10-CM | POA: Diagnosis not present

## 2021-02-23 DIAGNOSIS — E669 Obesity, unspecified: Secondary | ICD-10-CM | POA: Diagnosis not present

## 2021-02-23 DIAGNOSIS — E269 Hyperaldosteronism, unspecified: Secondary | ICD-10-CM | POA: Diagnosis not present

## 2021-03-12 DIAGNOSIS — N182 Chronic kidney disease, stage 2 (mild): Secondary | ICD-10-CM | POA: Diagnosis not present

## 2021-03-12 DIAGNOSIS — E669 Obesity, unspecified: Secondary | ICD-10-CM | POA: Diagnosis not present

## 2021-03-12 DIAGNOSIS — I10 Essential (primary) hypertension: Secondary | ICD-10-CM | POA: Diagnosis not present

## 2021-03-12 DIAGNOSIS — E269 Hyperaldosteronism, unspecified: Secondary | ICD-10-CM | POA: Diagnosis not present

## 2021-03-12 DIAGNOSIS — Z6837 Body mass index (BMI) 37.0-37.9, adult: Secondary | ICD-10-CM | POA: Diagnosis not present

## 2021-03-16 ENCOUNTER — Other Ambulatory Visit: Payer: Self-pay

## 2021-03-16 ENCOUNTER — Encounter: Payer: Self-pay | Admitting: Neurology

## 2021-03-16 ENCOUNTER — Ambulatory Visit (INDEPENDENT_AMBULATORY_CARE_PROVIDER_SITE_OTHER): Payer: BC Managed Care – PPO | Admitting: Neurology

## 2021-03-16 VITALS — BP 201/133 | HR 84 | Ht 70.0 in | Wt 261.4 lb

## 2021-03-16 DIAGNOSIS — R400 Somnolence: Secondary | ICD-10-CM | POA: Diagnosis not present

## 2021-03-16 DIAGNOSIS — G40309 Generalized idiopathic epilepsy and epileptic syndromes, not intractable, without status epilepticus: Secondary | ICD-10-CM | POA: Diagnosis not present

## 2021-03-16 MED ORDER — CARBAMAZEPINE 200 MG PO TABS: 200.0000 mg | ORAL_TABLET | Freq: Two times a day (BID) | ORAL | 3 refills | Status: DC

## 2021-03-16 NOTE — Patient Instructions (Signed)
Always good to see you! Wishing you well for the upcoming surgery.  Continue carbamazepine 200mg  twice a day  2. Schedule home sleep study  3. Follow-up in 1 year, call for any changes   Seizure Precautions: 1. If medication has been prescribed for you to prevent seizures, take it exactly as directed.  Do not stop taking the medicine without talking to your doctor first, even if you have not had a seizure in a long time.   2. Avoid activities in which a seizure would cause danger to yourself or to others.  Don't operate dangerous machinery, swim alone, or climb in high or dangerous places, such as on ladders, roofs, or girders.  Do not drive unless your doctor says you may.  3. If you have any warning that you may have a seizure, lay down in a safe place where you can't hurt yourself.    4.  No driving for 6 months from last seizure, as per [facility].   Please refer to the following link on the Epilepsy Foundation of America's website for more information: http://www.epilepsyfoundation.org/answerplace/Social/driving/drivingu.cfm   5.  Maintain good sleep hygiene. Avoid alcohol.   6.  Contact your doctor if you have any problems that may be related to the medicine you are taking.  7.  Call 911 and bring the patient back to the ED if:        A.  The seizure lasts longer than 5 minutes.       B.  The patient doesn't awaken shortly after the seizure  C.  The patient has new problems such as difficulty seeing, speaking or moving  D.  The patient was injured during the seizure  E.  The patient has a temperature over 102 F (39C)  F.  The patient vomited and now is having trouble breathing

## 2021-03-16 NOTE — Progress Notes (Signed)
NEUROLOGY FOLLOW UP OFFICE NOTE  Tristin Vandeusen [id number] 05-07-1976  HISTORY OF PRESENT ILLNESS: I had the pleasure of seeing Trevonte Ashkar in follow-up in the neurology clinic on 03/16/2021.  The patient was last seen a year ago for seizures. He is alone in the office today. He continues to do well seizure-free since 09/2017 when carbamazepine 200mg  BID was restarted. He reports an incident last Wednesday where his wife mentioned his arms were moving in his sleep, but he is not sure if it was a seizure. He has not been resting well, there have been several sick family members. He reports daytime drowsiness and getting 3-6 hours of sleep at the most. He denies any staring/unresponsive episodes, gaps in time, olfactory/gustatory hallucinations, focal numbness/tingling/weakness, myoclonic jerks. His BP today is 201/133, he denies any headaches. He had dizziness last week and had BP medication changes last Friday. He is scheduled for right adrenalectomy on 11/21 for hyperaldosteronism. He has been exercising and lost 8 lbs.    History on Initial Assessment 09/08/2017: This is a very pleasant 45 year old right-handed man with a history of hypertension, asthma, and seizures, presenting to establish care. He reports a history of seizures between the ages of 1 to 45 where he was told he would having staring spells and generalized convulsions. He was taking a medication called Slobid(?) which helped with the seizures, this was discontinued, he reports that seizures stopped with prayers and medication was stopped. He did well with no seizures until his senior year in college at age 45 or 24 when he was sleep deprived he would have episodes of shaking of the upper extremities where his arms would jerk and he could not move them for 15 seconds to 2 minutes. He states he would be conscious during them, legs are not affected, he would not fall if they occurred standing. He had them on and off when he would  work night shifts, occurring in the early morning hours, sometimes waking him up from sleep. He did not take any medication until he started seeing neurologist Dr. 30 in the mid-2000s and was started on carbamazepine 200mg  BID. He was not having the seizures while on carbamazepine, no side effects, but stopped the medication 4-5 years ago after he stopped seeing Dr. Adelene [employer]ler. He has not had any of the prior symptoms he was having when younger, but now his wife feels he is having nocturnal seizures. He wakes up and shakes a little like he is stretching his arms. He states they are not like the seizures in college, "it's more like you yawn and stretch, then arms are stiff." He goes back to sleep but states he remembers everything. They mostly occur in the hours between 3-5AM. His wife has told him she feels him stiffening, but they do not speak to each other. He has not bitten his tongue or had urinary incontinence. These do not happen in the daytime. He denies any staring/unresponsive episodes, gaps in time, focal numbness/tingling/weakness. He recalls olfactory and epigastric/nausea symptoms prior to his seizures in college where "air would smell different," this has not happened recently.    He has a history of right-sided Bell's palsy in 2004 with mild residual facial weakness and synkinesis. He has low back pain. He has dry mouth (which he had with carbamazepine in the past, now attributes to BP medication). He denies any headaches, dizziness, diplopia, dysarthria/dysphagia, neck pain, bowel/bladder dysfunction. He works in [employer] and drives. He was told he had a  normal MRI and EEG, as well as sleep study in the mid-2000s with Dr. Adelene Idler, records requested for review.    Epilepsy Risk Factors:  He had a normal birth and early development.  There is no history of febrile convulsions, CNS infections such as meningitis/encephalitis, significant traumatic brain injury, neurosurgical procedures, or family history  of seizures   PAST MEDICAL HISTORY: Past Medical History:  Diagnosis Date   Allergy    Asthma, chronic 07/14/2014   Hypertension    Seizures (HCC)     MEDICATIONS: Current Outpatient Medications on File Prior to Visit  Medication Sig Dispense Refill   aspirin 81 MG tablet Take 81 mg by mouth daily.     carbamazepine (TEGRETOL) 200 MG tablet Take 1 tablet (200 mg total) by mouth 2 (two) times daily. 180 tablet 3   Cholecalciferol (VITAMIN D PO) Take 8,000 Units by mouth daily.     hydrALAZINE (APRESOLINE) 100 MG tablet Take 100 mg by mouth 2 (two) times daily.      ibuprofen (ADVIL) 400 MG tablet Take 1 tablet (400 mg total) by mouth every 6 (six) hours as needed. 30 tablet 0   metoprolol succinate (TOPROL-XL) 100 MG 24 hr tablet Take 1 tablet (100 mg total) by mouth daily. 30 tablet 1   montelukast (SINGULAIR) 10 MG tablet Take 10 mg by mouth at bedtime.     Multiple Vitamin (MULTIVITAMIN) capsule Take 1 capsule by mouth daily.     Omega-3 Fatty Acids (FISH OIL) 1000 MG CAPS Take by mouth daily.     telmisartan (MICARDIS) 80 MG tablet Take 80 mg by mouth daily.     TURMERIC PO Take by mouth.     [DISCONTINUED] eplerenone (INSPRA) 25 MG tablet Take 1 tablet (25 mg total) by mouth daily. 30 tablet 1   No current facility-administered medications on file prior to visit.    ALLERGIES: No Known Allergies  FAMILY HISTORY: Family History  Problem Relation Age of Onset   Heart disease Mother    Heart disease Maternal Uncle    Arthritis Maternal Grandmother    Prostate cancer Maternal Uncle    Diabetes Maternal Uncle     SOCIAL HISTORY: Social History   Socioeconomic History   Marital status: Married    Spouse name: Not on file   Number of children: Not on file   Years of education: Not on file   Highest education level: Not on file  Occupational History   Not on file  Tobacco Use   Smoking status: Never   Smokeless tobacco: Never  Vaping Use   Vaping Use: Never used   Substance and Sexual Activity   Alcohol use: No   Drug use: No   Sexual activity: Yes    Partners: Female  Other Topics Concern   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      Home Situation: lives with wife and two children       Spiritual Beliefs:  Christian      Lifestyle: no regular exercise; healthy  diet      Right handed    Social Determinants of Health   Financial Resource Strain: Not on file  Food Insecurity: Not on file  Transportation Needs: Not on file  Physical Activity: Not on file  Stress: Not on file  Social Connections: Not on file  Intimate Partner Violence: Not on file  PHYSICAL EXAM: Vitals:   03/16/21 1504  BP: (!) 201/133  Pulse: 84  SpO2: 100%   General: No acute distress Head:  Normocephalic/atraumatic Skin/Extremities: No rash, no edema Neurological Exam: alert and awake. No aphasia or dysarthria. Fund of knowledge is appropriate.  Recent and remote memory are intact.  Attention and concentration are normal.   Cranial nerves: Pupils equal, round. Extraocular movements intact with no nystagmus. Visual fields full.  No facial asymmetry.  Motor: Bulk and tone normal, muscle strength 5/5 throughout with no pronator drift.   Finger to nose testing intact.  Gait narrow-based and steady, able to tandem walk adequately.  Romberg negative.   IMPRESSION: This is a very pleasant 45 yo RH man with a history of hypertension, asthma, seizures since childhood, suggestive of generalized epilepsy. He denies any seizures since 09/2017 on carbamazepine 200mg  BID. He reports poor sleep and daytime drowsiness, proceed with home sleep study. He continues to deal with uncontrolled hypertension due to hyperaldosteronism and is scheduled for right adrenalectomy next month. He is aware of [place] driving laws to stop driving after a seizure until 6 months seizure-free. Follow-up in 1 year, he knows  to call for any changes.   Thank you for allowing me to participate in his care.  Please do not hesitate to call for any questions or concerns.    , M.D.   CC: Dr. Patrcia Dolly

## 2021-04-26 ENCOUNTER — Other Ambulatory Visit: Payer: Self-pay | Admitting: Neurology

## 2021-05-03 DIAGNOSIS — Z20822 Contact with and (suspected) exposure to covid-19: Secondary | ICD-10-CM | POA: Diagnosis not present

## 2021-05-06 DIAGNOSIS — I10 Essential (primary) hypertension: Secondary | ICD-10-CM | POA: Diagnosis not present

## 2021-05-06 DIAGNOSIS — E269 Hyperaldosteronism, unspecified: Secondary | ICD-10-CM | POA: Diagnosis not present

## 2021-05-13 ENCOUNTER — Other Ambulatory Visit: Payer: Self-pay | Admitting: Family Medicine

## 2021-05-13 ENCOUNTER — Encounter: Payer: Self-pay | Admitting: Family Medicine

## 2021-05-13 DIAGNOSIS — Z20828 Contact with and (suspected) exposure to other viral communicable diseases: Secondary | ICD-10-CM

## 2021-05-13 MED ORDER — XOFLUZA (80 MG DOSE) 2 X 40 MG PO TBPK: 80.0000 mg | ORAL_TABLET | Freq: Once | ORAL | 0 refills | Status: DC

## 2021-05-14 DIAGNOSIS — E669 Obesity, unspecified: Secondary | ICD-10-CM | POA: Diagnosis not present

## 2021-05-14 DIAGNOSIS — Z6837 Body mass index (BMI) 37.0-37.9, adult: Secondary | ICD-10-CM | POA: Diagnosis not present

## 2021-05-14 DIAGNOSIS — N182 Chronic kidney disease, stage 2 (mild): Secondary | ICD-10-CM | POA: Diagnosis not present

## 2021-05-14 DIAGNOSIS — E269 Hyperaldosteronism, unspecified: Secondary | ICD-10-CM | POA: Diagnosis not present

## 2021-05-14 DIAGNOSIS — I10 Essential (primary) hypertension: Secondary | ICD-10-CM | POA: Diagnosis not present

## 2021-05-18 DIAGNOSIS — E269 Hyperaldosteronism, unspecified: Secondary | ICD-10-CM | POA: Diagnosis not present

## 2021-05-18 DIAGNOSIS — I10 Essential (primary) hypertension: Secondary | ICD-10-CM | POA: Diagnosis not present

## 2021-06-17 DIAGNOSIS — Z6838 Body mass index (BMI) 38.0-38.9, adult: Secondary | ICD-10-CM | POA: Diagnosis not present

## 2021-06-17 DIAGNOSIS — E269 Hyperaldosteronism, unspecified: Secondary | ICD-10-CM | POA: Diagnosis not present

## 2021-06-17 DIAGNOSIS — E669 Obesity, unspecified: Secondary | ICD-10-CM | POA: Diagnosis not present

## 2021-06-17 DIAGNOSIS — I152 Hypertension secondary to endocrine disorders: Secondary | ICD-10-CM | POA: Diagnosis not present

## 2021-06-21 ENCOUNTER — Ambulatory Visit (INDEPENDENT_AMBULATORY_CARE_PROVIDER_SITE_OTHER): Payer: BC Managed Care – PPO | Admitting: Family Medicine

## 2021-06-21 VITALS — BP 184/110 | HR 82 | Temp 98.4°F | Ht 71.0 in | Wt 262.8 lb

## 2021-06-21 DIAGNOSIS — I152 Hypertension secondary to endocrine disorders: Secondary | ICD-10-CM

## 2021-06-21 DIAGNOSIS — Z1159 Encounter for screening for other viral diseases: Secondary | ICD-10-CM

## 2021-06-21 DIAGNOSIS — I10 Essential (primary) hypertension: Secondary | ICD-10-CM

## 2021-06-21 DIAGNOSIS — Z1211 Encounter for screening for malignant neoplasm of colon: Secondary | ICD-10-CM

## 2021-06-21 DIAGNOSIS — Z23 Encounter for immunization: Secondary | ICD-10-CM

## 2021-06-21 DIAGNOSIS — Z125 Encounter for screening for malignant neoplasm of prostate: Secondary | ICD-10-CM | POA: Diagnosis not present

## 2021-06-21 DIAGNOSIS — Z0000 Encounter for general adult medical examination without abnormal findings: Secondary | ICD-10-CM | POA: Diagnosis not present

## 2021-06-21 DIAGNOSIS — Z6836 Body mass index (BMI) 36.0-36.9, adult: Secondary | ICD-10-CM

## 2021-06-21 DIAGNOSIS — E896 Postprocedural adrenocortical (-medullary) hypofunction: Secondary | ICD-10-CM

## 2021-06-21 LAB — CBC WITH DIFFERENTIAL/PLATELET
Basophils Absolute: 0 10*3/uL (ref 0.0–0.1)
Basophils Relative: 0.7 % (ref 0.0–3.0)
Eosinophils Absolute: 0.1 10*3/uL (ref 0.0–0.7)
Eosinophils Relative: 2.1 % (ref 0.0–5.0)
HCT: 47.3 % (ref 39.0–52.0)
Hemoglobin: 15.9 g/dL (ref 13.0–17.0)
Lymphocytes Relative: 58.2 % — ABNORMAL HIGH (ref 12.0–46.0)
Lymphs Abs: 2.4 10*3/uL (ref 0.7–4.0)
MCHC: 33.6 g/dL (ref 30.0–36.0)
MCV: 92.6 fl (ref 78.0–100.0)
Monocytes Absolute: 0.4 10*3/uL (ref 0.1–1.0)
Monocytes Relative: 10.2 % (ref 3.0–12.0)
Neutro Abs: 1.2 10*3/uL — ABNORMAL LOW (ref 1.4–7.7)
Neutrophils Relative %: 28.8 % — ABNORMAL LOW (ref 43.0–77.0)
Platelets: 349 10*3/uL (ref 150.0–400.0)
RBC: 5.11 Mil/uL (ref 4.22–5.81)
RDW: 14.2 % (ref 11.5–15.5)
WBC: 4.1 10*3/uL (ref 4.0–10.5)

## 2021-06-21 LAB — TSH: TSH: 2.09 u[IU]/mL (ref 0.35–5.50)

## 2021-06-21 LAB — BASIC METABOLIC PANEL
BUN: 19 mg/dL (ref 6–23)
CO2: 27 mEq/L (ref 19–32)
Calcium: 10.2 mg/dL (ref 8.4–10.5)
Chloride: 99 mEq/L (ref 96–112)
Creatinine, Ser: 1.17 mg/dL (ref 0.40–1.50)
GFR: 74.15 mL/min (ref >60.00)
Glucose, Bld: 101 mg/dL — ABNORMAL HIGH (ref 70–99)
Potassium: 3.8 mEq/L (ref 3.5–5.1)
Sodium: 138 mEq/L (ref 135–145)

## 2021-06-21 LAB — LIPID PANEL
Cholesterol: 202 mg/dL — ABNORMAL HIGH (ref 0–200)
HDL: 63.1 mg/dL (ref >39.00)
LDL Cholesterol: 113 mg/dL — ABNORMAL HIGH (ref 0–99)
NonHDL: 139.16
Total CHOL/HDL Ratio: 3
Triglycerides: 132 mg/dL (ref 0.0–149.0)
VLDL: 26.4 mg/dL (ref 0.0–40.0)

## 2021-06-21 LAB — T4, FREE: Free T4: 0.82 ng/dL (ref 0.60–1.60)

## 2021-06-21 LAB — HEMOGLOBIN A1C: Hgb A1c MFr Bld: 5.7 % (ref 4.6–6.5)

## 2021-06-21 LAB — PSA: PSA: 0.77 ng/mL (ref 0.10–4.00)

## 2021-06-21 NOTE — Patient Instructions (Signed)
A referral was placed for you for weight management clinic.  You should expect a phone call about scheduling this appointment.  A referral was also placed for colonoscopy to screen for colon cancer.  You should also expect a call about scheduling this appointment.

## 2021-06-21 NOTE — Progress Notes (Signed)
Subjective:     Jerome Hensley is a 46 y.o. male and is here for a comprehensive physical exam. The patient reports elevation in BP.  Followed by [name] nephrology in [place], s/p right adrenalectomy for hyperaldosteronism on 05/06/21.  Meds being adjusted.  Exercising by lifting weights 3 x/wk and walking.  Weighed 281 lbs in October.  Got down to 251 lbs prior to surgery, but has been gaining ever since.  Currently 261 lbs.  Interested in wt management.  Has not had a colonoscopy yet.  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      Home Situation: lives with wife and two children       Spiritual Beliefs:  Christian      Lifestyle: no regular exercise; healthy  diet      Right handed    Social Determinants of Health   Financial Resource Strain: Not on file  Food Insecurity: Not on file  Transportation Needs: Not on file  Physical Activity: Not on file  Stress: Not on file  Social Connections: Not on file  Intimate Partner Violence: Not on file   Health Maintenance  Topic Date Due   COVID-19 Vaccine (1) Never done   Pneumococcal Vaccine 28-23 Years old (1 - PCV) Never done   Hepatitis C Screening  Never done   COLONOSCOPY (Pts 45-35yrs Insurance coverage will need to be confirmed)  Never done   INFLUENZA VACCINE  01/04/2021   TETANUS/TDAP  08/03/2024   HIV Screening  Completed   HPV VACCINES  Aged Out    The following  portions of the patient's history were reviewed and updated as appropriate: allergies, current medications, past family history, past medical history, past social history, past surgical history, and problem list.  Review of Systems Pertinent items noted in HPI and remainder of comprehensive ROS otherwise negative.   Objective:    BP (!) 184/110 (BP Location: Left Arm, Patient Position: Sitting, Cuff Size: Large)    Pulse 82    Temp 98.4 F (36.9 C) (Oral)    Ht 5\' 11"  (1.803 m)    Wt 262 lb 12.8 oz (119.2 kg)    SpO2 98%    BMI 36.65 kg/m  General appearance: alert and cooperative Head: Normocephalic, without obvious abnormality, atraumatic Eyes: conjunctivae/corneas clear. PERRL, EOM's intact. Fundi benign. Ears: normal TM's and external ear canals both ears Nose: Nares normal. Septum midline. Mucosa normal. No drainage or sinus tenderness. Throat: lips, mucosa, and tongue normal; teeth and gums normal Neck: no adenopathy, no carotid bruit, no JVD, supple, symmetrical, trachea midline, and thyroid not enlarged, symmetric, no tenderness/mass/nodules Lungs: clear to auscultation bilaterally Heart: regular rate and rhythm, S1, S2 normal, no murmur, click, rub or gallop Abdomen: soft, non-tender; bowel sounds normal; no masses,  no organomegaly Extremities:  extremities normal, atraumatic, no cyanosis or edema Pulses: 2+ and symmetric Skin: Skin color, texture, turgor normal. No rashes or lesions Lymph nodes: Cervical, supraclavicular, and axillary nodes normal. Neurologic: Alert and oriented X 3, normal strength and tone. Normal symmetric reflexes. Normal coordination and gait    Assessment:    Healthy male exam.      Plan:    Anticipatory guidance given including wearing seatbelts, smoke detectors in the home, increasing physical activity, increasing p.o. intake of water and vegetables. -labs -colonoscopy due -discussed immunizations. See After Visit Summary for Counseling  Recommendations   Hypertension due to Endocrine disorder  -uncontrolled -followed by Nephrology s/p adrenalectomy for hyperaldosteronism -continue current medications including Norvasc 5 mg, toprol XL 100 mg, Hydralazine 100 mg TID, telmesartan-HCTZ 40-12.5 mg -encouraged to keep appt for upcoming sleep study. - Plan: Basic metabolic panel, TSH, T4, Free, Lipid panel  Need for influenza vaccination  - Plan: Flu Vaccine QUAD 6+ mos PF IM (Fluarix Quad PF)  H/O total adrenalectomy (HCC) -due to h/o hyperaldosteronism -followed by [facility]phrology  Class 2 severe obesity due to excess calories with serious comorbidity and body mass index (BMI) of 36.0 to 36.9 in adult (HCC)  -Weight 251 lbs prior to surgery, gaining s/p adrenalectomy. -continue exercise and lifestyle modifications -referral to wt management - Plan: Hemoglobin A1c, Amb Ref to Medical Weight Management  Encounter for hepatitis C screening test for low risk patient  - Plan: Hep C Antibody  Screening for prostate cancer  - Plan: PSA  Colon cancer screening  - Plan: Ambulatory referral to Gastroenterology  F/u in the next 3 months, sooner if needed  Abbe Amsterdam, MD

## 2021-06-22 LAB — HEPATITIS C ANTIBODY
Hepatitis C Ab: NONREACTIVE
SIGNAL TO CUT-OFF: 0.18 (ref <1.00)

## 2021-06-28 DIAGNOSIS — I152 Hypertension secondary to endocrine disorders: Secondary | ICD-10-CM | POA: Diagnosis not present

## 2021-06-28 DIAGNOSIS — E269 Hyperaldosteronism, unspecified: Secondary | ICD-10-CM | POA: Diagnosis not present

## 2021-06-28 DIAGNOSIS — E669 Obesity, unspecified: Secondary | ICD-10-CM | POA: Diagnosis not present

## 2021-06-29 ENCOUNTER — Encounter: Payer: Self-pay | Admitting: Family Medicine

## 2021-06-29 DIAGNOSIS — I152 Hypertension secondary to endocrine disorders: Secondary | ICD-10-CM | POA: Diagnosis not present

## 2021-06-29 DIAGNOSIS — E669 Obesity, unspecified: Secondary | ICD-10-CM | POA: Diagnosis not present

## 2021-06-29 DIAGNOSIS — E269 Hyperaldosteronism, unspecified: Secondary | ICD-10-CM | POA: Diagnosis not present

## 2021-06-29 DIAGNOSIS — Z6838 Body mass index (BMI) 38.0-38.9, adult: Secondary | ICD-10-CM | POA: Diagnosis not present

## 2021-06-29 DIAGNOSIS — E896 Postprocedural adrenocortical (-medullary) hypofunction: Secondary | ICD-10-CM | POA: Insufficient documentation

## 2021-07-27 ENCOUNTER — Encounter: Payer: Self-pay | Admitting: Family Medicine

## 2021-07-30 DIAGNOSIS — E669 Obesity, unspecified: Secondary | ICD-10-CM | POA: Diagnosis not present

## 2021-07-30 DIAGNOSIS — E269 Hyperaldosteronism, unspecified: Secondary | ICD-10-CM | POA: Diagnosis not present

## 2021-07-30 DIAGNOSIS — I152 Hypertension secondary to endocrine disorders: Secondary | ICD-10-CM | POA: Diagnosis not present

## 2021-08-02 ENCOUNTER — Other Ambulatory Visit: Payer: Self-pay

## 2021-08-02 ENCOUNTER — Encounter: Payer: BC Managed Care – PPO | Attending: Family Medicine | Admitting: Dietician

## 2021-08-02 ENCOUNTER — Encounter: Payer: Self-pay | Admitting: Dietician

## 2021-08-02 DIAGNOSIS — E269 Hyperaldosteronism, unspecified: Secondary | ICD-10-CM | POA: Diagnosis not present

## 2021-08-02 DIAGNOSIS — Z713 Dietary counseling and surveillance: Secondary | ICD-10-CM | POA: Insufficient documentation

## 2021-08-02 DIAGNOSIS — Z6838 Body mass index (BMI) 38.0-38.9, adult: Secondary | ICD-10-CM | POA: Diagnosis not present

## 2021-08-02 DIAGNOSIS — E669 Obesity, unspecified: Secondary | ICD-10-CM

## 2021-08-02 DIAGNOSIS — Z6836 Body mass index (BMI) 36.0-36.9, adult: Secondary | ICD-10-CM | POA: Diagnosis not present

## 2021-08-02 DIAGNOSIS — I152 Hypertension secondary to endocrine disorders: Secondary | ICD-10-CM | POA: Diagnosis not present

## 2021-08-02 NOTE — Patient Instructions (Addendum)
Begin to take a walk during your lunch break. Try to do this 2-3 days a week for 20-30 minutes.  Remember "Energy Balance"! Use your worksheet to track all the changes you make every day, big or small!  Try to pair your fruit with a source of protein for snacks.  "Proportions" instead of "Portions"  Begin to build your meals using the proportions of the Balanced Plate. First, select your carb choice(s) for the meal. Make this 25% of your meal. Next, select your source of protein to pair with your carb choice(s). Make this another 25% of your meal. Finally, complete your meal with a variety of non-starchy vegetables. Make this the remaining 50% of your meal.

## 2021-08-02 NOTE — Progress Notes (Signed)
Medical Nutrition Therapy  Appointment Start time:  [id number]  Appointment End time:  1720  Primary concerns today: Dietary Changes for weight loss  Referral diagnosis: E66.01 - Obesity Preferred learning style: No preference indicated Learning readiness: Ready   NUTRITION ASSESSMENT   Anthropometrics  None Taken   Clinical Medical Hx: Adrenalectomy, Hyperaldosteronism, HTN Medications: Amlodipine, Tegretol, Telmisartan, HCTZ Labs: TC - 202, LDL - 113, A1c - 5.7% Notable Signs/Symptoms: None   Lifestyle & Dietary Hx Pt reports history of severely elevated BP. Pt states that they were able to lower their BP when they lost weight through exercise in the past. Pt had right adrenal gland removed in December due to hyperaldosteronism, pt still has elevated BP and has gained weight since surgery. Pt has been unable to exercise as much since surgery. Pt reports history of lifting weights and walking for exercise and really enjoyed it. Pt states that family obligations do not allow them to exercise like they previously did at the moment. 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  Pt reports they will be doing a sleep study in the near future to try to improve their sleep quality, pt may be at risk for OSA. Pt eats breakfast, lunch, and dinner during the week with snacks in the morning and afternoon. Pt states that they do not eat as much on the weekends, Pt believes it is because they are less stressed. Pt reports drinking around 64-96 oz of water daily.    Estimated daily fluid intake: 96 oz Supplements: MV, Fish Oil, Vitamin D Sleep: Poor sleep, likes to stay up late for "me time", occasionally sleeps on the couch. Stress / self-care: Mild, worries about the role as a father and provider. Current average weekly physical activity: ADLs   24-Hr Dietary Recall First Meal: 3 small pancakes,  Snack: none Second Meal: Chipotle fried chicken fingers from [employer], asparagus,  corn on the cob Snack: none Third Meal: Hello fresh Steak, potato casserole, arugula, spinach, and pear salad Snack: none Beverages: Water,   NUTRITION DIAGNOSIS  NB-1.1 Food and nutrition-related knowledge deficit As related to obesity.  As evidenced by BMI of 36.65 kg/m2, sedentary lifestyle, and consistent consumption of energy dense foods.   NUTRITION INTERVENTION  Nutrition education (E-1) on the following topics:  Educated patient on the balanced plate eating model. Recommended lunch and dinner be 1/2 non-starchy vegetables, 1/4 starches, and 1/4 protein. Recommended breakfast be a balance of starch and protein with a piece of fruit. Discussed with patient the importance of working towards hitting the proportions of the balanced plate consistently.  Educated patient on the two components of energy balance: Energy in (calories), and energy out (activity). Explain the role of negative energy balance in weight loss. Discussed options with patient to achieve a negative energy balance and how to best control energy in and energy out to accommodate their lifestyle Educate pt on factors that can elevate LDL cholesterol, including high dietary intake of saturated fats. Educate pt on identifying sources of saturated fats, and how to make alternative food choices to lower saturated fat intake.  Educate on the role of elevated LDL,total cholesterol, and triglycerides on cardiovascular health. Educate pt on the role of physical activity in lowering LDL and increasing HDL cholesterol.   Handouts Provided Include  Balanced Plate Energy Balance Handout Proteins food list  Learning Style & Readiness for Change Teaching method utilized: Visual & Auditory  Demonstrated degree of understanding via: Teach Back  Barriers to  learning/adherence to lifestyle change: None  Goals Established by Pt Begin to take a walk during your lunch break. Try to do this 2-3 days a week for 20-30 minutes. Remember  "Energy Balance"! Use your worksheet to track all the changes you make every day, big or small! Try to pair your fruit with a source of protein for snacks. "Proportions" instead of "Portions" Begin to build your meals using the proportions of the Balanced Plate. First, select your carb choice(s) for the meal. Make this 25% of your meal. Next, select your source of protein to pair with your carb choice(s). Make this another 25% of your meal. Finally, complete your meal with a variety of non-starchy vegetables. Make this the remaining 50% of your meal.   MONITORING & EVALUATION Dietary intake, weekly physical activity, and energy balance in 2 months.  Next Steps  Patient is to follow up with RD.

## 2021-09-14 ENCOUNTER — Ambulatory Visit: Payer: BC Managed Care – PPO | Admitting: Dietician

## 2021-10-26 ENCOUNTER — Ambulatory Visit: Payer: BC Managed Care – PPO | Admitting: Dietician

## 2021-11-02 ENCOUNTER — Encounter: Payer: BC Managed Care – PPO | Attending: Family Medicine | Admitting: Skilled Nursing Facility1

## 2021-11-02 ENCOUNTER — Encounter: Payer: Self-pay | Admitting: Skilled Nursing Facility1

## 2021-11-02 DIAGNOSIS — E669 Obesity, unspecified: Secondary | ICD-10-CM | POA: Diagnosis not present

## 2021-11-02 NOTE — Progress Notes (Signed)
Medical Nutrition Therapy    Primary concerns today: Dietary Changes for weight loss  Referral diagnosis: E66.01 - Obesity Preferred learning style: No preference indicated Learning readiness: Ready   NUTRITION ASSESSMENT    Clinical Medical Hx: Adrenalectomy, Hyperaldosteronism, HTN Medications: Amlodipine (increased), Tegretol, Telmisartan, HCTZ Labs: TC - 202, LDL - 113, A1c - 5.7% Notable Signs/Symptoms: None   Lifestyle & Dietary Hx  Pt states he is working on trying to schedule a sleep study.  Pt states since his last visit he got down to 255 pounds and working out but then had to stop his workouts due to family life thinking maybe now that school is winding down he could increase his activity again. Pt states he thinks he will get back into walking and light weights with increased reps some time soon.   Pt states he will walk to [employer] from his job sometimes.  Pt states he has a sedentary lifestyle. Pt states he is an emotional eater. Pt states food is apart of his relaxation at the end of the day. Pt states his snacks do not happen at night slept better not as emotional of a eater.    Estimated daily fluid intake: 96 oz Supplements: MV, Fish Oil, Vitamin D Sleep: Poor sleep, likes to stay up late for "me time", occasionally sleeps on the couch. Stress / self-care: Mild, worries about the role as a father and provider. Current average weekly physical activity: ADLs   24-Hr Dietary Recall First Meal 7:30-9am: 1 packet sugar free oatmeal  Snack: none or fruit or trailmix bar Second Meal 12:45-1:30-2: salad: chopped carrots, dried cranberries, green apple slices, fried chicken strips, blue cheese crumbles Snack: sometimes fruit or candy or tailmix bar Third Meal: Hello fresh Sometimes or take out (pizza or [place] or wings) Snack: trailmix 1-2 or sometimes chips or dried chocolate covered cherries or dark chocolate raisins  Beverages: Water, 1-2 cups black coffee, green  hot tea + honey, other hot tea   NUTRITION DIAGNOSIS  NB-1.1 Food and nutrition-related knowledge deficit As related to obesity.  As evidenced by BMI of 36.65 kg/m2, sedentary lifestyle, and consistent consumption of energy dense foods.   NUTRITION INTERVENTION  Nutrition education (E-1) on the following topics:  Educated patient on the balanced plate eating model. Recommended lunch and dinner be 1/2 non-starchy vegetables, 1/4 starches, and 1/4 protein. Recommended breakfast be a balance of starch and protein with a piece of fruit. Discussed with patient the importance of working towards hitting the proportions of the balanced plate consistently.  Educated patient on the two components of energy balance: Energy in (calories), and energy out (activity). Explain the role of negative energy balance in weight loss. Discussed options with patient to achieve a negative energy balance and how to best control energy in and energy out to accommodate their lifestyle Educate pt on factors that can elevate LDL cholesterol, including high dietary intake of saturated fats. Educate pt on identifying sources of saturated fats, and how to make alternative food choices to lower saturated fat intake.  Educate on the role of elevated LDL,total cholesterol, and triglycerides on cardiovascular health. Educate pt on the role of physical activity in lowering LDL and increasing HDL cholesterol.   Handouts Previously Provided Include  Balanced Plate Energy Balance Handout Proteins food list  Learning Style & Readiness for Change Teaching method utilized: Visual & Auditory  Demonstrated degree of understanding via: Teach Back  Barriers to learning/adherence to lifestyle change: None  Goals Established by Pt -  physical activity to reduce stress/reduce emotional eating and come down from the work day   MONITORING & EVALUATION Dietary intake, weekly physical activity, and energy balance in 2 months.  Next Steps   Patient is to follow up with RD.

## 2021-11-29 DIAGNOSIS — Z6838 Body mass index (BMI) 38.0-38.9, adult: Secondary | ICD-10-CM | POA: Diagnosis not present

## 2021-11-29 DIAGNOSIS — E669 Obesity, unspecified: Secondary | ICD-10-CM | POA: Diagnosis not present

## 2021-11-29 DIAGNOSIS — E269 Hyperaldosteronism, unspecified: Secondary | ICD-10-CM | POA: Diagnosis not present

## 2021-11-29 DIAGNOSIS — I152 Hypertension secondary to endocrine disorders: Secondary | ICD-10-CM | POA: Diagnosis not present

## 2022-03-08 ENCOUNTER — Encounter: Payer: Self-pay | Admitting: Neurology

## 2022-03-08 ENCOUNTER — Ambulatory Visit (INDEPENDENT_AMBULATORY_CARE_PROVIDER_SITE_OTHER): Payer: BC Managed Care – PPO | Admitting: Neurology

## 2022-03-08 VITALS — BP 188/136 | HR 80 | Ht 70.0 in | Wt 259.4 lb

## 2022-03-08 DIAGNOSIS — G40309 Generalized idiopathic epilepsy and epileptic syndromes, not intractable, without status epilepticus: Secondary | ICD-10-CM | POA: Diagnosis not present

## 2022-03-08 MED ORDER — CARBAMAZEPINE 200 MG PO TABS: 200.0000 mg | ORAL_TABLET | Freq: Two times a day (BID) | ORAL | 3 refills | Status: DC

## 2022-03-08 NOTE — Progress Notes (Signed)
NEUROLOGY FOLLOW UP OFFICE NOTE  Jerome Hensley [id number] 08/09/1975  HISTORY OF PRESENT ILLNESS: I had the pleasure of seeing Jerome Hensley in follow-up in the neurology clinic on 03/08/2022.  The patient was last seen a year ago for seizures. He is alone in the office today. Since his last visit, he continues to deny any seizures since 09/2017 on carbamazepine 200mg  BID, no side effects. He denies any staring/unresponsive episodes, gaps in time, olfactory/gustatory hallucinations, focal numbness/tingling/weakness, myoclonic jerks. His BP today is again elevated 197/135. He had an adrenalectomy and states that it improved his numbers but his BP has remained high despite being on multiple antihypertensives. He and his nephrologist are now working on controlling BP with his diet, he has lost weight and cut down on sugar. He states BP is elevated today also partly because he has been rushing. No headaches, dizziness, diplopia. No falls. 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 He gets 5-7 hours of sleep and snores, sometimes with apneic episodes. He will be scheduling his sleep study soon.    History on Initial Assessment 09/08/2017: This is a very pleasant 46 year old right-handed man with a history of hypertension, asthma, and seizures, presenting to establish care. He reports a history of seizures between the ages of 1 to 46 where he was told he would having staring spells and generalized convulsions. He was taking a medication called Slobid(?) which helped with the seizures, this was discontinued, he reports that seizures stopped with prayers and medication was stopped. He did well with no seizures until his senior year in college at age 46 or 46 when he was sleep deprived he would have episodes of shaking of the upper extremities where his arms would jerk and he could not move them for 15 seconds to 2 minutes. He states he would be conscious  during them, legs are not affected, he would not fall if they occurred standing. He had them on and off when he would work night shifts, occurring in the early morning hours, sometimes waking him up from sleep. He did not take any medication until he started seeing neurologist Dr. Macario Carls in the mid-2000s and was started on carbamazepine 200mg  BID. He was not having the seizures while on carbamazepine, no side effects, but stopped the medication 4-5 years ago after he stopped seeing Dr. Macario Carls. He has not had any of the prior symptoms he was having when younger, but now his wife feels he is having nocturnal seizures. He wakes up and shakes a little like he is stretching his arms. He states they are not like the seizures in college, "it's more like you yawn and stretch, then arms are stiff." He goes back to sleep but states he remembers everything. They mostly occur in the hours between 3-5AM. His wife has told him she feels him stiffening, but they do not speak to each other. He has not bitten his tongue or had urinary incontinence. These do not happen in the daytime. He denies any staring/unresponsive episodes, gaps in time, focal numbness/tingling/weakness. He recalls olfactory and epigastric/nausea symptoms prior to his seizures in college where "air would smell different," this has not happened recently.    He has a history of right-sided Bell's palsy in 2004 with mild residual facial weakness and synkinesis. He has low back pain. He has dry mouth (which he had with carbamazepine in the past, now attributes to BP medication). He denies  any headaches, dizziness, diplopia, dysarthria/dysphagia, neck pain, bowel/bladder dysfunction. He works in Consulting civil engineer and drives. He was told he had a normal MRI and EEG, as well as sleep study in the mid-2000s with Dr. Adelene Idler, records requested for review.    Epilepsy Risk Factors:  He had a normal birth and early development.  There is no history of febrile convulsions, CNS  infections such as meningitis/encephalitis, significant traumatic brain injury, neurosurgical procedures, or family history of seizures   PAST MEDICAL HISTORY: Past Medical History:  Diagnosis Date   Allergy    Asthma, chronic 07/14/2014   Hypertension    Seizures (HCC)     MEDICATIONS: Current Outpatient Medications on File Prior to Visit  Medication Sig Dispense Refill   amLODipine (NORVASC) 10 MG tablet Take 1 tablet by mouth daily.     aspirin 81 MG tablet Take 81 mg by mouth daily.     carbamazepine (TEGRETOL) 200 MG tablet Take 1 tablet (200 mg total) by mouth 2 (two) times daily. 180 tablet 3   Cholecalciferol (VITAMIN D PO) Take 8,000 Units by mouth daily.     hydrALAZINE (APRESOLINE) 100 MG tablet Take by mouth 2 (two) times daily.     hydrochlorothiazide (HYDRODIURIL) 25 MG tablet Take 25 mg by mouth daily.     metoprolol succinate (TOPROL-XL) 100 MG 24 hr tablet Take 1 tablet (100 mg total) by mouth daily. 30 tablet 1   montelukast (SINGULAIR) 10 MG tablet Take 10 mg by mouth at bedtime.     Multiple Vitamin (MULTIVITAMIN) capsule Take 1 capsule by mouth daily.     Omega-3 Fatty Acids (FISH OIL) 1000 MG CAPS Take by mouth daily.     telmisartan (MICARDIS) 80 MG tablet Take 80 mg by mouth daily.     TURMERIC PO Take by mouth.     No current facility-administered medications on file prior to visit.    ALLERGIES: No Known Allergies  FAMILY HISTORY: Family History  Problem Relation Age of Onset   Heart disease Mother    Heart disease Maternal Uncle    Arthritis Maternal Grandmother    Prostate cancer Maternal Uncle    Diabetes Maternal Uncle     SOCIAL HISTORY: Social History   Socioeconomic History   Marital status: Married    Spouse name: Not on file   Number of children: Not on file   Years of education: Not on file   Highest education level: Not on file  Occupational History   Not on file  Tobacco Use   Smoking status: Never   Smokeless tobacco: Never   Vaping Use   Vaping Use: Never used  Substance and Sexual Activity   Alcohol use: No   Drug use: No   Sexual activity: Yes    Partners: Female  Other Topics Concern   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      Home Situation: lives with wife and two children       Spiritual Beliefs:  Christian      Lifestyle: no regular exercise; healthy  diet      Right handed    Social Determinants of Health   Financial Resource Strain: Not on file  Food Insecurity: Not on file  Transportation Needs: Not on file  Physical Activity: Not on file  Stress: Not on file  Social Connections: Not on file  Intimate Partner Violence: Not  on file     PHYSICAL EXAM: Vitals:   03/08/22 1448 03/08/22 1528  BP: (!) 197/135 (!) 188/136  Pulse: 80    General: No acute distress Head:  Normocephalic/atraumatic Skin/Extremities: No rash, no edema Neurological Exam: alert and awake. No aphasia or dysarthria. Fund of knowledge is appropriate. Attention and concentration are normal.   Cranial nerves: Pupils equal, round. Extraocular movements intact with no nystagmus. Visual fields full.  He has slight contracture at the right nasolabial fold with synkinesis from old Bell's palsy. Motor: Bulk and tone normal, muscle strength 5/5 throughout with no pronator drift.   Finger to nose testing intact.  Gait narrow-based and steady, able to tandem walk adequately.  Romberg negative.   IMPRESSION: This is a very pleasant 46 yo RH man with a history of hypertension, asthma, seizures since childhood, suggestive of generalized epilepsy. He continues to do well seizure-free since 09/2017 on  carbamazepine 200mg  BID, refills sent. He will be scheduling sleep study soon. BP today again significantly elevated, continue follow-up with PCP and Nephrology. He is aware of [place] driving laws to stop driving after a seizure until 6 months seizure-free.  Follow-up in 1 year, call for any changes.    Thank you for allowing me to participate in his care.  Please do not hesitate to call for any questions or concerns.    , M.D.   CC: Dr. Patrcia Dolly

## 2022-03-08 NOTE — Patient Instructions (Signed)
Always good to see you. Continue carbamazepine 200mg  twice a day.   Proceed with sleep study as planned.  Follow-up in 1 year, call for any changes   Seizure Precautions: 1. If medication has been prescribed for you to prevent seizures, take it exactly as directed.  Do not stop taking the medicine without talking to your doctor first, even if you have not had a seizure in a long time.   2. Avoid activities in which a seizure would cause danger to yourself or to others.  Don't operate dangerous machinery, swim alone, or climb in high or dangerous places, such as on ladders, roofs, or girders.  Do not drive unless your doctor says you may.  3. If you have any warning that you may have a seizure, lay down in a safe place where you can't hurt yourself.    4.  No driving for 6 months from last seizure, as per [facility].   Please refer to the following link on the [place] website for more information: http://[email or web address]/[email or web address]   5.  Maintain good sleep hygiene. Avoid alcohol.  6.  Contact your doctor if you have any problems that may be related to the medicine you are taking.  7.  Call 911 and bring the patient back to the ED if:        A.  The seizure lasts longer than 5 minutes.       B.  The patient doesn't awaken shortly after the seizure  C.  The patient has new problems such as difficulty seeing, speaking or moving  D.  The patient was injured during the seizure  E.  The patient has a temperature over 102 F (39C)  F.  The patient vomited and now is having trouble breathing

## 2022-03-14 DIAGNOSIS — E669 Obesity, unspecified: Secondary | ICD-10-CM | POA: Diagnosis not present

## 2022-03-14 DIAGNOSIS — I152 Hypertension secondary to endocrine disorders: Secondary | ICD-10-CM | POA: Diagnosis not present

## 2022-03-14 DIAGNOSIS — Z6837 Body mass index (BMI) 37.0-37.9, adult: Secondary | ICD-10-CM | POA: Diagnosis not present

## 2022-03-14 DIAGNOSIS — E269 Hyperaldosteronism, unspecified: Secondary | ICD-10-CM | POA: Diagnosis not present

## 2022-04-13 ENCOUNTER — Telehealth: Payer: Self-pay | Admitting: *Deleted

## 2022-04-13 ENCOUNTER — Encounter: Payer: Self-pay | Admitting: *Deleted

## 2022-04-13 DIAGNOSIS — J45909 Unspecified asthma, uncomplicated: Secondary | ICD-10-CM | POA: Diagnosis not present

## 2022-04-13 DIAGNOSIS — I152 Hypertension secondary to endocrine disorders: Secondary | ICD-10-CM | POA: Diagnosis not present

## 2022-04-13 NOTE — Patient Instructions (Signed)
Visit Information  Thank you for taking time to visit with me today. Please don't hesitate to contact me if I can be of assistance to you.   Following are the goals we discussed today:   Goals Addressed               This Visit's Progress     COMPLETED: No needs today (pt-stated)        Care Coordination Interventions: Provided education to patient and/or caregiver about advanced directives Reviewed medications with patient and discussed adherence with all prescribed medications Reviewed scheduled/upcoming provider appointments including pending appointments with sufficient transportation Screening for signs and symptoms of depression related to chronic disease state  Assessed social determinant of health barriers          Please call the care guide team at [phone] if you need to cancel or reschedule your appointment.   If you are experiencing a Mental Health or Behavioral Health Crisis or need someone to talk to, please call the Suicide and Crisis Lifeline: 988  Patient verbalizes understanding of instructions and care plan provided today and agrees to view in MyChart. Active MyChart status and patient understanding of how to access instructions and care plan via MyChart confirmed with patient.     No further follow up required: No needs  Elliot Cousin, RN Care Management Coordinator [facility] Main Office [phone]

## 2022-04-13 NOTE — Patient Outreach (Signed)
  Care Coordination   Initial Visit Note   04/13/2022 Name: Jerome Hensley MRN: [id number] DOB: 1975/09/21  Jerome Hensley is a 46 y.o. year old male who sees Jerome Saint, MD for primary care. I spoke with  Jerome Hensley by phone today.  What matters to the patients health and wellness today?  No needs    Goals Addressed               This Visit's Progress     COMPLETED: No needs today (pt-stated)        Care Coordination Interventions: Provided education to patient and/or caregiver about advanced directives Reviewed medications with patient and discussed adherence with all prescribed medications Reviewed scheduled/upcoming provider appointments including pending appointments with sufficient transportation Screening for signs and symptoms of depression related to chronic disease state  Assessed social determinant of health barriers          SDOH assessments and interventions completed:  Yes  SDOH Interventions Today    Flowsheet Row Most Recent Value  SDOH Interventions   Food Insecurity Interventions Intervention Not Indicated  Housing Interventions Intervention Not Indicated  Transportation Interventions Intervention Not Indicated  Utilities Interventions Intervention Not Indicated        Care Coordination Interventions Activated:  Yes  Care Coordination Interventions:  Yes, provided   Follow up plan: No further intervention required.   Encounter Outcome:  Pt. Visit Completed   Elliot Cousin, RN Care Management Coordinator [facility] Main Office [phone]

## 2022-05-10 DIAGNOSIS — I152 Hypertension secondary to endocrine disorders: Secondary | ICD-10-CM | POA: Diagnosis not present

## 2022-05-10 DIAGNOSIS — E669 Obesity, unspecified: Secondary | ICD-10-CM | POA: Diagnosis not present

## 2022-05-10 DIAGNOSIS — E269 Hyperaldosteronism, unspecified: Secondary | ICD-10-CM | POA: Diagnosis not present

## 2022-07-15 ENCOUNTER — Ambulatory Visit (INDEPENDENT_AMBULATORY_CARE_PROVIDER_SITE_OTHER): Payer: BC Managed Care – PPO | Admitting: Family Medicine

## 2022-07-15 VITALS — BP 126/92 | HR 81 | Temp 98.7°F | Ht 69.5 in | Wt 259.4 lb

## 2022-07-15 DIAGNOSIS — Z125 Encounter for screening for malignant neoplasm of prostate: Secondary | ICD-10-CM | POA: Diagnosis not present

## 2022-07-15 DIAGNOSIS — I10 Essential (primary) hypertension: Secondary | ICD-10-CM | POA: Diagnosis not present

## 2022-07-15 DIAGNOSIS — Z1211 Encounter for screening for malignant neoplasm of colon: Secondary | ICD-10-CM | POA: Diagnosis not present

## 2022-07-15 DIAGNOSIS — R253 Fasciculation: Secondary | ICD-10-CM | POA: Diagnosis not present

## 2022-07-15 DIAGNOSIS — Z0000 Encounter for general adult medical examination without abnormal findings: Secondary | ICD-10-CM

## 2022-07-15 NOTE — Progress Notes (Signed)
Established Patient Office Visit   Subjective  Patient ID: Jerome Hensley, male    DOB: Mar 23, 1976  Age: 47 y.o. MRN: [id number]  Chief Complaint  Patient presents with   Annual Exam    Pt is a 47 yo male with pmh sig for HTN, asthma, allergies, and h/o epilepsy who was seen for CPE and f/u.  Pt states he is doing better than last OFV.  Working on dealing with stress better.  Bp elevated on several meds.  Pt with h/o snoring at night.        ROS Negative unless stated above    Objective:     BP (!) 126/92 (BP Location: Right Arm, Cuff Size: Large)   Pulse 81   Temp 98.7 F (37.1 C) (Oral)   Ht 5' 9.5" (1.765 m)   Wt 259 lb 6.4 oz (117.7 kg)   SpO2 100%   BMI 37.76 kg/m    Physical Exam Constitutional:      Appearance: Normal appearance.  HENT:     Head: Normocephalic and atraumatic.     Right Ear: Tympanic membrane, ear canal and external ear normal.     Left Ear: Tympanic membrane, ear canal and external ear normal.     Nose: Nose normal.     Mouth/Throat:     Mouth: Mucous membranes are moist.     Pharynx: No oropharyngeal exudate or posterior oropharyngeal erythema.  Eyes:     General: No scleral icterus.    Extraocular Movements: Extraocular movements intact.     Conjunctiva/sclera: Conjunctivae normal.     Pupils: Pupils are equal, round, and reactive to light.  Neck:     Thyroid: No thyromegaly.  Cardiovascular:     Rate and Rhythm: Normal rate and regular rhythm.     Pulses: Normal pulses.     Heart sounds: Normal heart sounds. No murmur heard.    No friction rub.  Pulmonary:     Effort: Pulmonary effort is normal.     Breath sounds: Normal breath sounds. No wheezing, rhonchi or rales.  Abdominal:     General: Bowel sounds are normal.     Palpations: Abdomen is soft.     Tenderness: There is no abdominal tenderness.  Musculoskeletal:        General: No deformity. Normal range of motion.     Comments: Muscle twitch noted.   Lymphadenopathy:     Cervical: No cervical adenopathy.  Skin:    General: Skin is warm and dry.     Findings: No lesion.  Neurological:     General: No focal deficit present.     Mental Status: He is alert and oriented to person, place, and time.  Psychiatric:        Mood and Affect: Mood normal.        Thought Content: Thought content normal.      No results found for any visits on 07/15/22.    07/15/2022    1:12 PM 04/13/2022    2:32 PM 08/02/2021    4:17 PM  Depression screen PHQ 2/9  Decreased Interest 0 0 0  Down, Depressed, Hopeless 0 0 0  PHQ - 2 Score 0 0 0  Altered sleeping 0    Tired, decreased energy 1    Change in appetite 0    Feeling bad or failure about yourself  0    Trouble concentrating 0    Moving slowly or fidgety/restless 0    Suicidal thoughts 0  PHQ-9 Score 1    Difficult doing work/chores Somewhat difficult        07/15/2022    1:12 PM  GAD 7 : Generalized Anxiety Score  Nervous, Anxious, on Edge 1  Control/stop worrying 1  Worry too much - different things 2  Trouble relaxing 2  Restless 0  Easily annoyed or irritable 0  Afraid - awful might happen 0  Total GAD 7 Score 6  Anxiety Difficulty Somewhat difficult       Assessment & Plan:  Well adult exam -Anticipatory guidance given including wearing seatbelts, smoke detectors in the home, increasing physical activity, increasing p.o. intake of water and vegetables. -labs -immunizations reviewed.  Pt declines flu. -colonoscopy due.  Referral placed -next CPE in 1 yr -     CBC with Differential/Platelet; Future -     Comprehensive metabolic panel; Future -     Hemoglobin A1c; Future -     Lipid panel; Future -     PSA; Future -     TSH; Future  Essential hypertension -uncontrolled -recheck 126/92 -Lifestyle modifications -Continue Norvasc 10 mg daily, spironolactone 50 mg daily, Toprol XL 100 mg, hydralazine 100 mg twice daily, HCTZ 25 mg daily -     Ambulatory referral to  Pulmonology  Colon cancer screening -     Ambulatory referral to Gastroenterology  Muscle twitch -     Comprehensive metabolic panel; Future -     Magnesium; Future -     TSH; Future  Prostate cancer screening -     PSA; Future   Return in about 3 months (around 10/13/2022).   Billie Ruddy, MD

## 2022-08-11 DIAGNOSIS — I152 Hypertension secondary to endocrine disorders: Secondary | ICD-10-CM | POA: Diagnosis not present

## 2022-08-11 DIAGNOSIS — E669 Obesity, unspecified: Secondary | ICD-10-CM | POA: Diagnosis not present

## 2022-08-11 DIAGNOSIS — Z6837 Body mass index (BMI) 37.0-37.9, adult: Secondary | ICD-10-CM | POA: Diagnosis not present

## 2022-08-11 DIAGNOSIS — E269 Hyperaldosteronism, unspecified: Secondary | ICD-10-CM | POA: Diagnosis not present

## 2022-08-22 ENCOUNTER — Encounter: Payer: Self-pay | Admitting: Family Medicine

## 2022-12-09 ENCOUNTER — Other Ambulatory Visit: Payer: Self-pay

## 2022-12-09 ENCOUNTER — Encounter: Payer: Self-pay | Admitting: Neurology

## 2022-12-09 MED ORDER — CARBAMAZEPINE 200 MG PO TABS: 200.0000 mg | ORAL_TABLET | Freq: Two times a day (BID) | ORAL | 0 refills | Status: DC

## 2023-03-10 ENCOUNTER — Ambulatory Visit: Payer: BC Managed Care – PPO | Admitting: Neurology

## 2023-03-26 ENCOUNTER — Other Ambulatory Visit: Payer: Self-pay | Admitting: Neurology

## 2023-04-23 ENCOUNTER — Other Ambulatory Visit: Payer: Self-pay | Admitting: Neurology

## 2023-05-14 ENCOUNTER — Other Ambulatory Visit: Payer: Self-pay | Admitting: Neurology

## 2023-05-15 ENCOUNTER — Other Ambulatory Visit: Payer: Self-pay

## 2023-05-15 MED ORDER — CARBAMAZEPINE 200 MG PO TABS: 200.0000 mg | ORAL_TABLET | Freq: Two times a day (BID) | ORAL | 0 refills | Status: DC

## 2023-05-21 ENCOUNTER — Other Ambulatory Visit: Payer: Self-pay | Admitting: Neurology

## 2023-08-15 ENCOUNTER — Encounter: Payer: Self-pay | Admitting: Neurology

## 2023-08-15 ENCOUNTER — Ambulatory Visit (INDEPENDENT_AMBULATORY_CARE_PROVIDER_SITE_OTHER): Payer: BC Managed Care – PPO | Admitting: Neurology

## 2023-08-15 VITALS — BP 150/100 | HR 78 | Ht 70.0 in | Wt 253.8 lb

## 2023-08-15 DIAGNOSIS — G40309 Generalized idiopathic epilepsy and epileptic syndromes, not intractable, without status epilepticus: Secondary | ICD-10-CM | POA: Diagnosis not present

## 2023-08-15 DIAGNOSIS — R400 Somnolence: Secondary | ICD-10-CM

## 2023-08-15 MED ORDER — CARBAMAZEPINE 200 MG PO TABS: 200.0000 mg | ORAL_TABLET | Freq: Two times a day (BID) | ORAL | 4 refills | Status: AC

## 2023-08-15 NOTE — Progress Notes (Signed)
 NEUROLOGY FOLLOW UP OFFICE NOTE  Jerome Hensley [id number] 05/30/1976  HISTORY OF PRESENT ILLNESS: I had the pleasure of seeing Jerome Hensley in follow-up in the neurology clinic on 08/15/2023.  The patient was last seen in October 2023 for seizures. He is alone in the office today. Records and images were personally reviewed where available.  Since his last visit, he continues to do well seizure-free since 09/2017 on Carbamazepine 200mg  BID without side effects. He denies any staring/unresponsive episodes, gaps in time, olfactory/gustatory hallucinations, focal numbness/tingling/weakness, myoclonic jerks. He has occasional left arm achiness, not clearly numbness. No significant headaches, dizziness, vision changes, no falls. BP is again elevated, similar to prior visits (161/105 then 150/100 at end of visit). He reports nephrologist have him on max doses of medications, it is up to him to do the exercise and diet changes. He gets 7 hours of sleep but continues to note daytime drowsiness and has been told he snores and has apneic episodes.    History on Initial Assessment 09/08/2017: This is a very pleasant 48 year old right-handed man with a history of hypertension, asthma, and seizures, presenting to establish care. He reports a history of seizures between the ages of 1 to 48 where he was told he would having staring spells and generalized convulsions. He was taking a medication called Slobid(?) which helped with the seizures, this was discontinued, he reports that seizures stopped with prayers and medication was stopped. He did well with no seizures until his senior year in college at age 19 or 48 when he was sleep deprived he would have episodes of shaking of the upper extremities where his arms would jerk and he could not move them for 15 seconds to 2 minutes. He states he would be conscious during them, legs are not affected, he would not fall if they occurred standing. He had them on and off  when he would work night shifts, occurring in the early morning hours, sometimes waking him up from sleep. He did not take any medication until he started seeing neurologist Dr. Adelene Idler in the mid-2000s and was started on carbamazepine 200mg  BID. He was not having the seizures while on carbamazepine, no side effects, but stopped the medication 4-5 years ago after he stopped seeing Dr. Adelene Idler. He has not had any of the prior symptoms he was having when younger, but now his wife feels he is having nocturnal seizures. He wakes up and shakes a little like he is stretching his arms. He states they are not like the seizures in college, "it's more like you yawn and stretch, then arms are stiff." He goes back to sleep but states he remembers everything. They mostly occur in the hours between 3-5AM. His wife has told him she feels him stiffening, but they do not speak to each other. He has not bitten his tongue or had urinary incontinence. These do not happen in the daytime. He denies any staring/unresponsive episodes, gaps in time, focal numbness/tingling/weakness. He recalls olfactory and epigastric/nausea symptoms prior to his seizures in college where "air would smell different," this has not happened recently.    He has a history of right-sided Bell's palsy in 2004 with mild residual facial weakness and synkinesis. He has low back pain. He has dry mouth (which he had with carbamazepine in the past, now attributes to BP medication). He denies any headaches, dizziness, diplopia, dysarthria/dysphagia, neck pain, bowel/bladder dysfunction. He works in Consulting civil engineer and drives. He was told he had a normal MRI  and EEG, as well as sleep study in the mid-2000s with Dr. Adelene Idler, records requested for review.    Epilepsy Risk Factors:  He had a normal birth and early development.  There is no history of febrile convulsions, CNS infections such as meningitis/encephalitis, significant traumatic brain injury, neurosurgical procedures, or  family history of seizures   PAST MEDICAL HISTORY: Past Medical History:  Diagnosis Date   Allergy    Asthma, chronic 07/14/2014   Hypertension    Seizures (HCC)     MEDICATIONS: Current Outpatient Medications on File Prior to Visit  Medication Sig Dispense Refill   amLODipine (NORVASC) 10 MG tablet Take 1 tablet by mouth daily.     aspirin 81 MG tablet Take 81 mg by mouth daily.     carbamazepine (TEGRETOL) 200 MG tablet TAKE 1 TABLET BY MOUTH TWICE A DAY 60 tablet 1   Cholecalciferol (VITAMIN D PO) Take 8,000 Units by mouth daily.     hydrALAZINE (APRESOLINE) 100 MG tablet Take by mouth 2 (two) times daily.     hydrochlorothiazide (HYDRODIURIL) 25 MG tablet Take 25 mg by mouth daily.     metoprolol succinate (TOPROL-XL) 100 MG 24 hr tablet Take 1 tablet (100 mg total) by mouth daily. (Patient taking differently: Take 25 mg by mouth daily.) 30 tablet 1   montelukast (SINGULAIR) 10 MG tablet Take 10 mg by mouth at bedtime.     Multiple Vitamin (MULTIVITAMIN) capsule Take 1 capsule by mouth daily.     Omega-3 Fatty Acids (FISH OIL) 1000 MG CAPS Take by mouth daily.     spironolactone (ALDACTONE) 50 MG tablet Take 50 mg by mouth daily.     telmisartan (MICARDIS) 80 MG tablet Take 80 mg by mouth daily.     TURMERIC PO Take by mouth.     No current facility-administered medications on file prior to visit.    ALLERGIES: No Known Allergies  FAMILY HISTORY: Family History  Problem Relation Age of Onset   Heart disease Mother    Heart disease Maternal Uncle    Arthritis Maternal Grandmother    Prostate cancer Maternal Uncle    Diabetes Maternal Uncle     SOCIAL HISTORY: Social History   Socioeconomic History   Marital status: Married    Spouse name: Not on file   Number of children: Not on file   Years of education: Not on file   Highest education level: Not on file  Occupational History   Not on file  Tobacco Use   Smoking status: Never   Smokeless tobacco: Never   Vaping Use   Vaping status: Never Used  Substance and Sexual Activity   Alcohol use: No   Drug use: No   Sexual activity: Yes    Partners: Female  Other Topics Concern   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      Home Situation: lives with wife and two children       Spiritual Beliefs:  Christian      Lifestyle: no regular exercise; healthy  diet      Right handed    Social Drivers of Health   Financial Resource Strain: Not on file  Food Insecurity: No Food Insecurity (04/13/2022)   Hunger Vital Sign    Worried About Running Out of Food in the Last Year: Never true    Ran Out of Food in the Last  Year: Never true  Transportation Needs: No Transportation Needs (04/13/2022)   PRAPARE - Administrator, Civil Service (Medical): No    Lack of Transportation (Non-Medical): No  Physical Activity: Not on file  Stress: Not on file  Social Connections: Not on file  Intimate Partner Violence: Not on file     PHYSICAL EXAM: Vitals:   08/15/23 1120 08/15/23 1152  BP: (!) 161/105 (!) 150/100  Pulse: 78   SpO2: 99%    General: No acute distress Head:  Normocephalic/atraumatic Skin/Extremities: No rash, no edema Neurological Exam: alert and awake. No aphasia or dysarthria. Fund of knowledge is appropriate. Attention and concentration are normal.   Cranial nerves: Pupils equal, round. Extraocular movements intact with no nystagmus. Visual fields full.  No facial asymmetry.  Motor: Bulk and tone normal, muscle strength 5/5 throughout with no pronator drift.   Finger to nose testing intact.  Gait narrow-based and steady, able to tandem walk adequately.  Romberg negative.   IMPRESSION: This is a very pleasant 48 yo RH man with a history of hypertension, asthma, seizures since childhood, suggestive of generalized epilepsy. He continues to do well seizure-free since 09/2017 on Carbamazepine 200mg   BID, refills sent. We again discussed concern for OSA, particularly with uncontrolled hypertension. Home sleep study will be ordered. Continue follow-up with Nephrology and PCP for BP control. He is aware of [place] driving laws to stop driving after a seizure until 6 months seizure-free. Follow-up in 1 year, call for any changes.    Thank you for allowing me to participate in his care.  Please do not hesitate to call for any questions or concerns.    Patrcia Dolly, M.D.   CC: Dr. Salomon Fick

## 2023-08-15 NOTE — Patient Instructions (Signed)
 Always good to see you.  Continue Carbamazepine 200mg  twice a day  2. Schedule home sleep study  3. Continue working on lowering BP   4. Follow-up in 1 year, call for any changes   Seizure Precautions: 1. If medication has been prescribed for you to prevent seizures, take it exactly as directed.  Do not stop taking the medicine without talking to your doctor first, even if you have not had a seizure in a long time.   2. Avoid activities in which a seizure would cause danger to yourself or to others.  Don't operate dangerous machinery, swim alone, or climb in high or dangerous places, such as on ladders, roofs, or girders.  Do not drive unless your doctor says you may.  3. If you have any warning that you may have a seizure, lay down in a safe place where you can't hurt yourself.    4.  No driving for 6 months from last seizure, as per [facility].   Please refer to the following link on the [employer]'s website for more information: http://www.epilepsyfoundation.org/answerplace/Social/driving/drivingu.cfm   5.  Maintain good sleep hygiene. Avoid alcohol.  6.  Contact your doctor if you have any problems that may be related to the medicine you are taking.  7.  Call 911 and bring the patient back to the ED if:        A.  The seizure lasts longer than 5 minutes.       B.  The patient doesn't awaken shortly after the seizure  C.  The patient has new problems such as difficulty seeing, speaking or moving  D.  The patient was injured during the seizure  E.  The patient has a temperature over 102 F (39C)  F.  The patient vomited and now is having trouble breathing

## 2024-04-18 ENCOUNTER — Encounter: Payer: Self-pay | Admitting: Neurology

## 2024-05-17 NOTE — Progress Notes (Signed)
 Jerome Hensley                                          MRN: [id number]   05/17/2024   The [facility] Quality Team Specialist reviewed this patient medical record for the purposes of chart review for care gap closure. The following were reviewed: chart review for care gap closure-colorectal cancer screening.    [facility] Quality Team

## 2024-08-14 ENCOUNTER — Ambulatory Visit: Admitting: Neurology
